# Patient Record
Sex: Female | Born: 1965 | Race: White | Hispanic: No | Marital: Married | Smoking: Never smoker
Health system: Southern US, Community
[De-identification: ages and names within clinical notes are randomized; demographics above are authoritative.]

---

## 2002-10-30 ENCOUNTER — Encounter (HOSPITAL_COMMUNITY): Admission: RE | Admit: 2002-10-30 | Discharge: 2002-11-29 | Payer: Self-pay | Admitting: Internal Medicine

## 2002-10-30 ENCOUNTER — Encounter: Payer: Self-pay | Admitting: Internal Medicine

## 2002-11-04 ENCOUNTER — Encounter: Payer: Self-pay | Admitting: Internal Medicine

## 2003-11-22 ENCOUNTER — Ambulatory Visit (HOSPITAL_COMMUNITY): Admission: RE | Admit: 2003-11-22 | Discharge: 2003-11-22 | Payer: Self-pay

## 2004-09-11 ENCOUNTER — Ambulatory Visit (HOSPITAL_COMMUNITY): Admission: RE | Admit: 2004-09-11 | Discharge: 2004-09-11 | Payer: Self-pay | Admitting: Obstetrics and Gynecology

## 2005-04-30 ENCOUNTER — Ambulatory Visit (HOSPITAL_COMMUNITY): Admission: RE | Admit: 2005-04-30 | Discharge: 2005-04-30 | Payer: Self-pay | Admitting: Internal Medicine

## 2007-01-10 ENCOUNTER — Ambulatory Visit (HOSPITAL_COMMUNITY): Admission: RE | Admit: 2007-01-10 | Discharge: 2007-01-10 | Payer: Self-pay | Admitting: Gastroenterology

## 2007-01-13 ENCOUNTER — Other Ambulatory Visit: Admission: RE | Admit: 2007-01-13 | Discharge: 2007-01-13 | Payer: Self-pay | Admitting: Obstetrics & Gynecology

## 2008-01-02 ENCOUNTER — Encounter (INDEPENDENT_AMBULATORY_CARE_PROVIDER_SITE_OTHER): Payer: Self-pay | Admitting: Orthopedic Surgery

## 2008-01-02 ENCOUNTER — Ambulatory Visit (HOSPITAL_BASED_OUTPATIENT_CLINIC_OR_DEPARTMENT_OTHER): Admission: RE | Admit: 2008-01-02 | Discharge: 2008-01-02 | Payer: Self-pay | Admitting: Orthopedic Surgery

## 2008-05-02 ENCOUNTER — Ambulatory Visit (HOSPITAL_COMMUNITY): Admission: RE | Admit: 2008-05-02 | Discharge: 2008-05-02 | Payer: Self-pay | Admitting: Internal Medicine

## 2008-05-21 ENCOUNTER — Other Ambulatory Visit: Admission: RE | Admit: 2008-05-21 | Discharge: 2008-05-21 | Payer: Self-pay | Admitting: Obstetrics & Gynecology

## 2010-12-26 NOTE — Op Note (Signed)
NAMEMORAYO, LEVEN               ACCOUNT NO.:  [id number]   MEDICAL RECORD NO.:  [id number]          PATIENT TYPE:  AMB   LOCATION:  DSC                          FACILITY:  [facility]   PHYSICIAN:  Cindee Salt, M.D.       DATE OF BIRTH:  [birth year]   DATE OF PROCEDURE:  01/02/2008  DATE OF DISCHARGE:                               OPERATIVE REPORT   PREOPERATIVE DIAGNOSIS:  Scapholunate ligament tear.   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   OPERATION:  Arthroscopy, debridement, biopsy, left wrist.   SURGEON:  Cindee Salt, MD   ASSISTANT:  Iman, RN   ANESTHESIA:  Axillary block.   DATE OF OPERATION:  Jan 02, 2008.   ANESTHESIOLOGIST:  Zenon Mayo, MD   HISTORY:  The patient is a 45 year old female with a history of wrist  pain.  This has not responded to conservative treatment.  MRI reveals a  scapholunate ligament injury.  She has no history of medical problems.  She is aware of risks and complications including infection, recurrence,  injury to arteries, nerves, and tendons, incomplete relief of symptoms,  and dystrophy.  Preoperatively, the patient is seen, questions  encouraged, and answered.  The extremity marked by both the patient and  surgeon.   PROCEDURE:  The patient was brought to the operating room, where an  axillary block was carried out without difficulty.  She was prepped  using DuraPrep, supine position, left arm free.  The limb was placed in  the arthroscopy tower and 10 pounds traction applied.  A time-out was  performed prior to placement.  The joint was inflated with 3-4 portals.  Using a 22-gauge needle, a transverse incision was made, deepened with  hemostat.  Blunt trocar was used to enter the joint, joint was  inspected, obvious crystalline deposition was present the articular  cartilage, the synovial tissue.  A tear of the scapholunate ligament was  immediately  apparent as was a tear of the lunotriquetral ligament. An  irrigation catheter was placed in 6U.  A 6R portal opened after  localization with a 22-gauge needle.  Biopsies were then performed of  any loose material in the joint including crystalline deposition.  This  was placed in absolute alcohol and sent to pathology for inspection.  The midcarpal joint was inspected.  Again, crystalline deposition was  noted in all the articular surfaces of all the bones.  Some thinning of  the cartilage was noted.  Significant instability of the lunate from  both scaphoid and lunate's triquetral side was noted.  A debridement was  then performed.  A ganglion cyst had formed on the dorsal surface of the  scapholunate ligament complex, this was debrided with an ArthroWand.  Limited shrinkage of the remaining scapholunate ligament was performed  with the ArthroWand on low power.  The wound was irrigated.  Partial  synovectomy performed using the ArthroWand.  The instruments were  removed.  The portals closed with interrupted 5-0 Vicryl Rapide sutures.  Sterile compressive and dorsal  palmar splint applied.  The patient tolerated the procedure well, was  taken to the recovery room for observation in satisfactory condition.  She will be discharged home to return to the hand center of [facility]  in 1 week on Percocet.           ______________________________  Cindee Salt, M.D.     GK/MEDQ  D:  01/02/2008  T:  01/03/2008  Job:  [id number]   cc:   Kingsley Callander. Ouida Sills, MD

## 2010-12-26 NOTE — Op Note (Signed)
NAMESONJI, Emma Compton               ACCOUNT NO.:  [id number]   MEDICAL RECORD NO.:  [id number]          PATIENT TYPE:  AMB   LOCATION:  ENDO                         FACILITY:  [facility]   PHYSICIAN:  Anselmo Rod, M.D.  DATE OF BIRTH:  Nov 17, 1965   DATE OF PROCEDURE:  01/10/2007  DATE OF DISCHARGE:  01/10/2007                               OPERATIVE REPORT   PROCEDURE PERFORMED:  Screening colonoscopy.   ENDOSCOPIST:  Anselmo Rod, M.D.   INSTRUMENT USED:  Pentax video colonoscope.   INDICATIONS FOR PROCEDURE:  A 45 year old [place] female with a  history of rectal bleeding, rule out colonic polyps, masses, etc.   PREPROCEDURE PREPARATION:  Informed consent was procured from the  patient.  The patient fasted for 4 hours prior to the procedure and  prepped with 32 OsmoPrep pills the night prior to the procedure. The  risks and benefits of the procedure including a 10% miss rate of cancer  and polyp were discussed with the patient as well.   PREPROCEDURE PHYSICAL:  The patient had stable vital signs.  Neck  supple.  Chest clear to auscultation.  S1 and S2 regular.  Abdomen soft  with normal bowel sounds.   DESCRIPTION OF PROCEDURE:  The patient was placed in the left lateral  decubitus position and sedated with 100 mcg of Fentanyl and 7.5 mg of  Versed given intravenously in slow incremental doses. Once the patient  was adequately sedated and maintained on low-flow oxygen and continuous  cardiac monitoring the Pentax video colonoscope was advanced from the  rectum to the cecum.  The appendiceal orifice and ileocecal valve were  clearly visualized and photographed.  The terminal ileum appeared  normal.  Retroflexion in the rectum revealed no abnormalities. No  masses, polyps or diverticula were noted. The patient tolerated the  procedure well without complications. On starting the procedure the  scope accidentally slipped into the vagina and a somewhat inflamed,  edematous  cervix was noted. I informed the patient and her husband about  this finding.   IMPRESSION:  Normal colonoscopy of the terminal ileum.  No masses,  polyps or diverticula noted.   RECOMMENDATIONS:  1.A gynecological examination has been requested. Dr.  Tresa Res has been contacted.  She will see the patient on 01/13/2007.  The  patient is aware of that.  2.Repeat colonoscopy in the next 10 years.  If the patient has any  abnormal symptoms in the interim she is to contact the office  immediately for further recommendations.  3.Outpatient follow-up as need arises in the future.      Anselmo Rod, M.D.  Electronically Signed     JNM/MEDQ  D:  01/13/2007  T:  01/13/2007  Job:  [id number]   cc:   Kingsley Callander. Ouida Sills, MD  Edwena Felty. Romine, M.D.

## 2010-12-29 NOTE — Op Note (Signed)
NAMEPIETRA, Emma Compton                           ACCOUNT NO.:  [id number]   MEDICAL RECORD NO.:  [id number]                   PATIENT TYPE:  AMB   LOCATION:                                       FACILITY:   PHYSICIAN:  Oley Balm. Pricilla Holm, D.P.M.             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   PROCEDURE:  Interphalangeal (IP) fusion, end-to-end anastomosis third toe  left foot.   SURGEON:  Oley Balm. Pricilla Holm, D.P.M.   TYPE OF ANESTHESIA:  Local.   INDICATIONS FOR SURGERY:  Longstanding history of pain unrelieved by  conservative care.   DESCRIPTION OF PROCEDURE:  The patient brought to the operating room and  placed on the operating table in the supine position  The patient's lower  right foot and leg was then prepped and draped in the usual aseptic manner.  Then, with an ankle tourniquet placed and well-padded to prevent contusion;  elevated to 250 mmHg, after exsanguination of the left foot, the following  surgical procedures were then performed under local anesthesia with a local  infiltrate of 2% Xylocaine and 0.05% Marcaine 5 cc.   IP FUSION THIRD TOE LEFT FOOT.  Attention was directed to the dorsal aspect  of the third toe of the left foot where 2 semi-elliptical converting skin  incisions were made.  The incision was widened and deepened via sharp and  blunt dissection being sure to identify and retract all vital structures.  A  capsular incision was then made and the interphalangeal joint was then  freed.  It was noted that the distal phalanx had been plantarly dislocated.  The wound was then lavaged with copious amounts of sterile saline and the  distal aspect of the middle phalanx as well as the distal  aspect of the  proximal phalanx were resected utilizing the Zimmer oscillating saw.  The  two opposing edges were then opposed and a 0.045 K-wire was retrograded  through the toe.   It was noted that the toe was deviated laterally so the K-wire was removed.  More bone was taken off medially and then the K-wire reinserted.  It was  noted that the toe was in a correct anatomical as well as functional  position.  The wound was lavaged with copious amounts of sterile saline and  the capsule and subcutaneous tissues were approximated with continuous  sutures of 4-0 Dexon.  The skin was approximated utilizing, running  subcuticular sutures of 4-0 Prolene.   All surgical sites were infiltrated with approximately 1/8 cc of  dexamethasone phosphate; and mild compressive bandages consisting of  Betadine soaked Adaptic, sterile 4 x 4's  and sterile Kling were then  applied.  The patient tolerated the procedure well and left the operating  room in apparent good condition, vital signs stable to recovery room.      ___________________________________________                                            Oley Balm Pricilla Holm, D.P.M.   DBT/MEDQ  D:  11/22/2003  T:  11/22/2003  Job:  [id number]   cc:   Lionel December, M.D.  [address]  [place]  [place] [address]  Fax: [phone]

## 2010-12-29 NOTE — H&P (Signed)
Emma Compton, UHLIG                         ACCOUNT NO.:  [id number]   MEDICAL RECORD NO.:  [id number]                   PATIENT TYPE:  AMB   LOCATION:  DAY                                  FACILITY:  [facility]   PHYSICIAN:  Oley Balm. Pricilla Holm, D.P.M.             DATE OF BIRTH:  03/10/1966   DATE OF ADMISSION:  11/22/2003  DATE OF DISCHARGE:                                HISTORY & PHYSICAL   Ms. Guedes is a 45 year old female that presented to the office with a chief  complaints of painful third toe of the left foot.  The patient had a  dorsally-contracted third toe.  She had an injury to the toe many years ago  and since that time she has had a dorsal contracture of the third toe with  adduction deformity noted.  The patient relates that she has difficulty  wearing end-closed shoes and requests surgical correction of same.   PREVIOUS HOSPITALIZATION/SURGERY:  Two children.   MEDICATIONS:  No medications.   ALLERGIES:  No allergies.   No transfusions or hepatitis.   REVIEW OF SYSTEMS:  Essentially uneventful.   EXTREMITIES:  Lower extremity exam reveals palpable pedal pulses.  Both DP  and PT with spontaneous capillary filling time.  NEUROLOGIC:  Essentially within normal limits.  MUSCULOSKELETAL:  The patient has a dorsally contracted third toe of the  left foot with an adduction deformity noted at the proximal interphalangeal  joint underlapping the second toe.  The patient also, on standing, has a  dorsally- contracted third toe.   X-rays taken reveal that the patient has had a previous dislocation of the  third toe of the left foot at the proximal interphalangeal joint.  This  deformity is rigid.   ASSESSMENT:  Hammer toe deformity with adduction deformity.   PLAN:  I have reviewed both conservative and surgical management.  I  reviewed with her procedures which consist of an IP fusion of the second toe  of the left foot.  I reviewed the procedures, the complications of  the  procedures, such as infection, bone infection, postoperative pain, swelling,  etc.  The patient understands the same.  Surgical consent form was reviewed  and signed and the patient is scheduled for surgery on November 22, 2003.     ___________________________________________                                         Oley Balm. Pricilla Holm, D.P.M.   DBT/MEDQ  D:  11/21/2003  T:  11/22/2003  Job:  [id number]   cc:   Lionel December, M.D.  [address]  [place]  [place] [address]  Fax: [phone]

## 2014-01-21 ENCOUNTER — Ambulatory Visit (HOSPITAL_COMMUNITY)
Admission: RE | Admit: 2014-01-21 | Discharge: 2014-01-21 | Disposition: A | Discharge status: Home / Self Care | Payer: 59 | Source: Ambulatory Visit | Attending: Internal Medicine | Admitting: Internal Medicine

## 2014-01-21 ENCOUNTER — Other Ambulatory Visit (HOSPITAL_COMMUNITY): Payer: Self-pay | Admitting: Internal Medicine

## 2014-01-21 DIAGNOSIS — M79609 Pain in unspecified limb: Secondary | ICD-10-CM | POA: Insufficient documentation

## 2014-01-21 DIAGNOSIS — R52 Pain, unspecified: Secondary | ICD-10-CM

## 2014-08-09 ENCOUNTER — Other Ambulatory Visit (HOSPITAL_COMMUNITY): Payer: Self-pay | Admitting: Internal Medicine

## 2014-08-09 DIAGNOSIS — G44209 Tension-type headache, unspecified, not intractable: Secondary | ICD-10-CM

## 2014-08-11 ENCOUNTER — Ambulatory Visit (HOSPITAL_COMMUNITY)
Admission: RE | Admit: 2014-08-11 | Discharge: 2014-08-11 | Disposition: A | Discharge status: Home / Self Care | Payer: 59 | Source: Ambulatory Visit | Attending: Internal Medicine | Admitting: Internal Medicine

## 2014-08-11 DIAGNOSIS — I679 Cerebrovascular disease, unspecified: Secondary | ICD-10-CM | POA: Diagnosis not present

## 2014-08-11 DIAGNOSIS — R110 Nausea: Secondary | ICD-10-CM | POA: Diagnosis present

## 2014-08-11 DIAGNOSIS — J342 Deviated nasal septum: Secondary | ICD-10-CM | POA: Insufficient documentation

## 2014-08-11 DIAGNOSIS — R51 Headache: Secondary | ICD-10-CM | POA: Insufficient documentation

## 2014-08-11 DIAGNOSIS — G44209 Tension-type headache, unspecified, not intractable: Secondary | ICD-10-CM

## 2015-09-27 DIAGNOSIS — Z6826 Body mass index (BMI) 26.0-26.9, adult: Secondary | ICD-10-CM | POA: Diagnosis not present

## 2015-09-27 DIAGNOSIS — J029 Acute pharyngitis, unspecified: Secondary | ICD-10-CM | POA: Diagnosis not present

## 2015-10-16 IMAGING — CT CT HEAD W/O CM
1 series · 16 of 30 positions shown, 20 images · non-contrast
Comparison: None.

CLINICAL DATA: Two week history of intermittent headache and nausea

EXAM:
CT HEAD WITHOUT CONTRAST
TECHNIQUE: Contiguous axial images were obtained from the base of the skull
through the vertex without intravenous contrast.

[Series 2: headseq 4.8 h37s · axial · 0.43mm/px · z∈[+93,+251]mm · 16 of 36 slices shown, 20 images]
[im 2/36  brain]
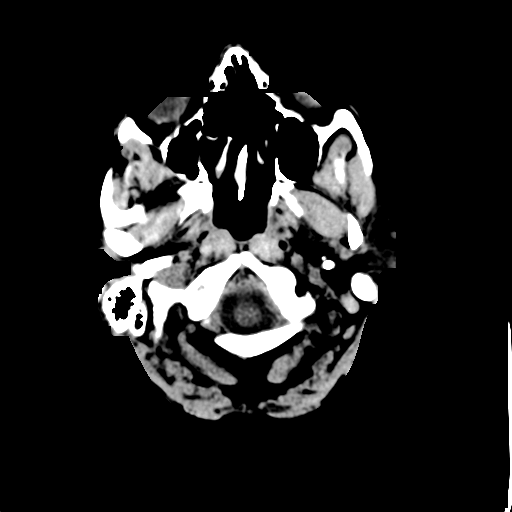
[im 2/36  bone]
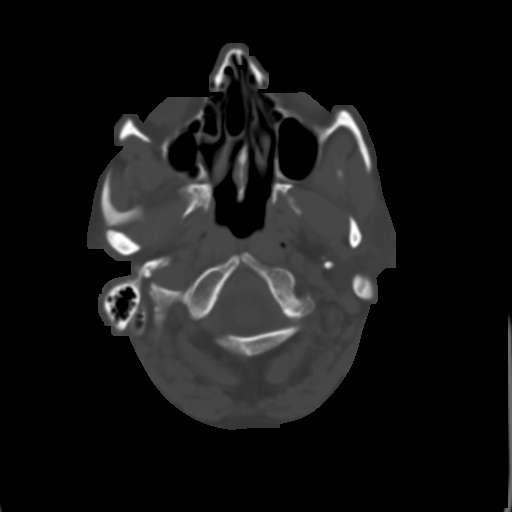
[im 4/36  brain]
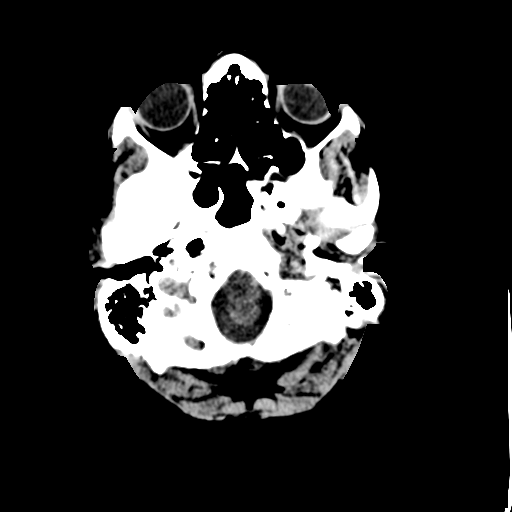
[im 7/36  brain]
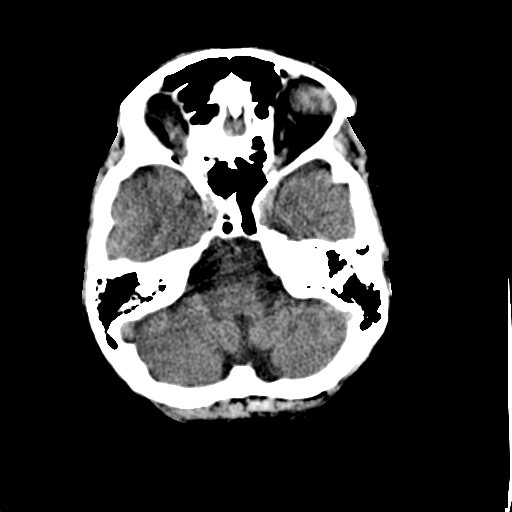
[im 9/36  brain]
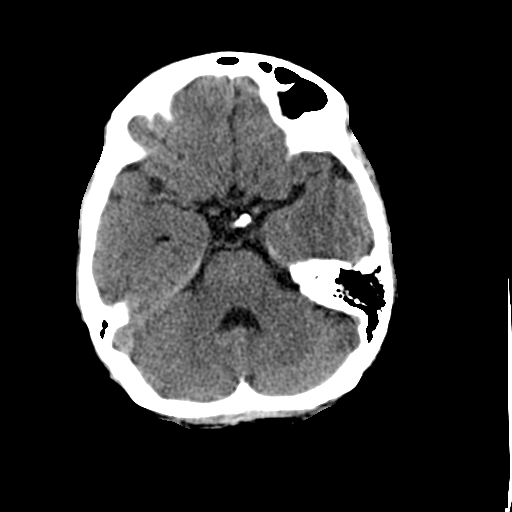
[im 10/36  brain]
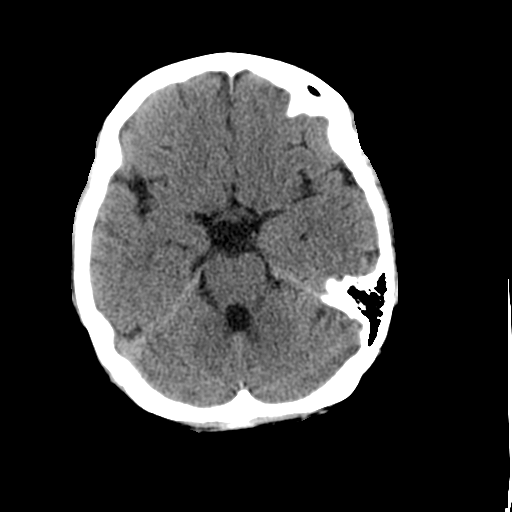
[im 10/36  bone]
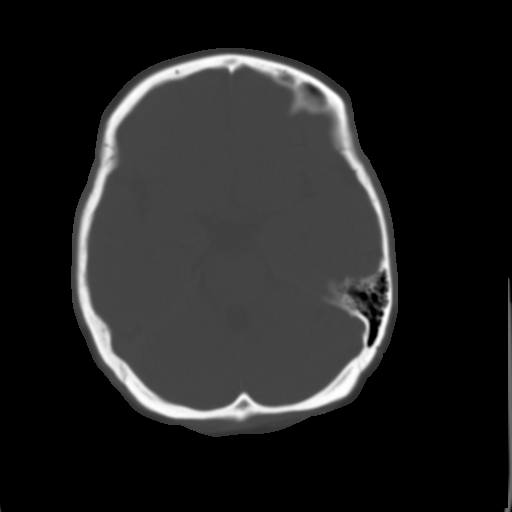
[im 13/36  brain]
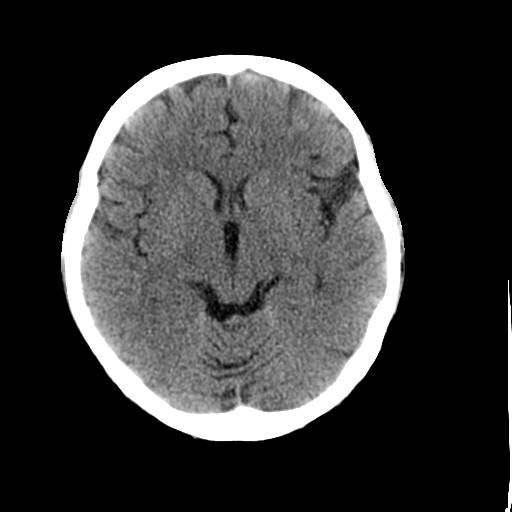
[im 15/36  brain]
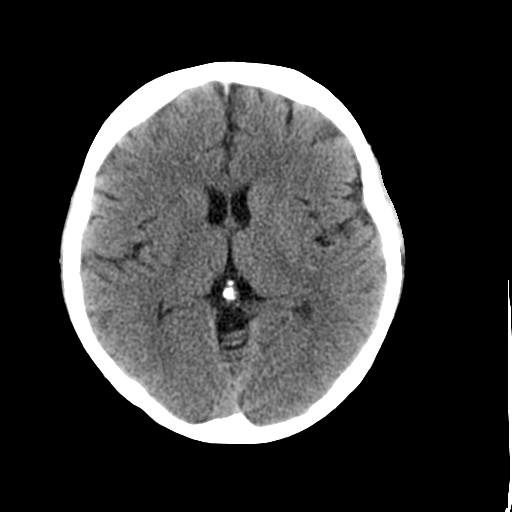
[im 17/36  brain]
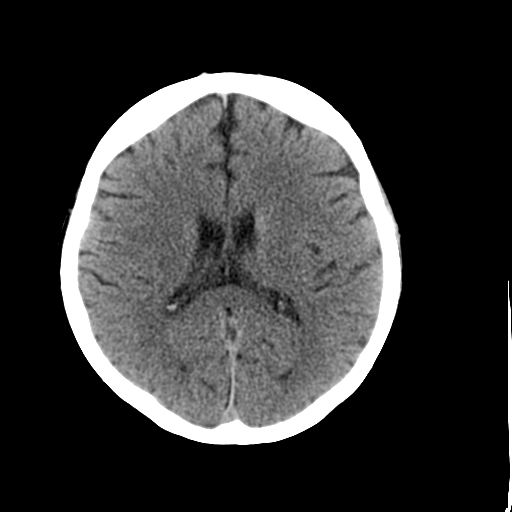
[im 19/36  brain]
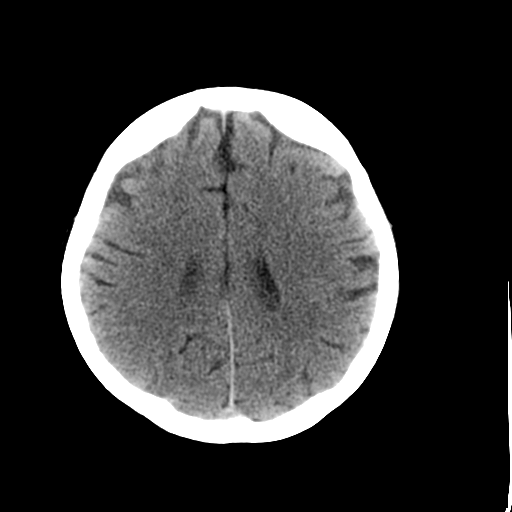
[im 19/36  bone]
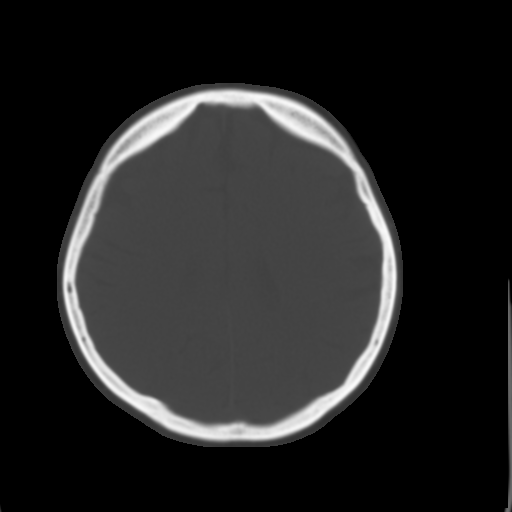
[im 21/36  brain]
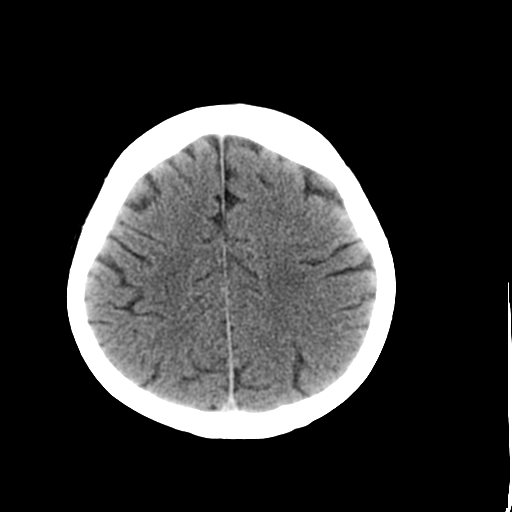
[im 23/36  brain]
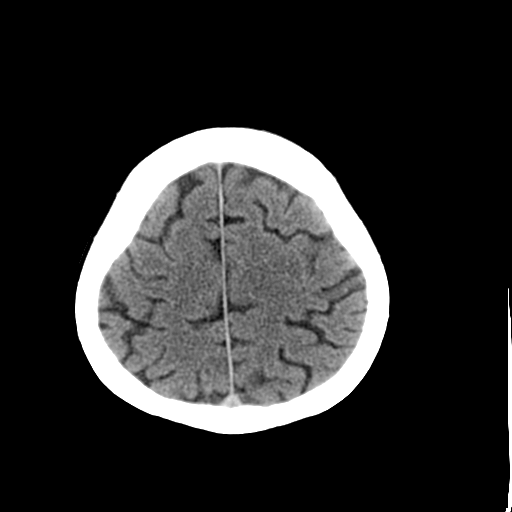
[im 26/36  brain]
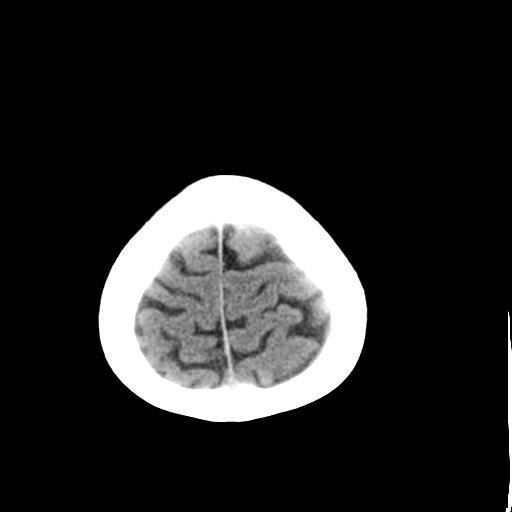
[im 27/36  brain]
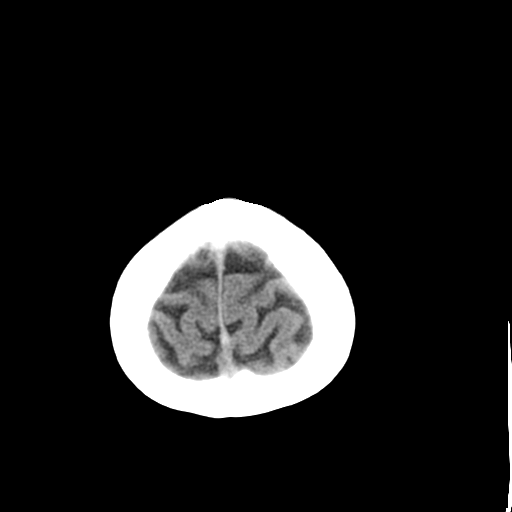
[im 27/36  bone]
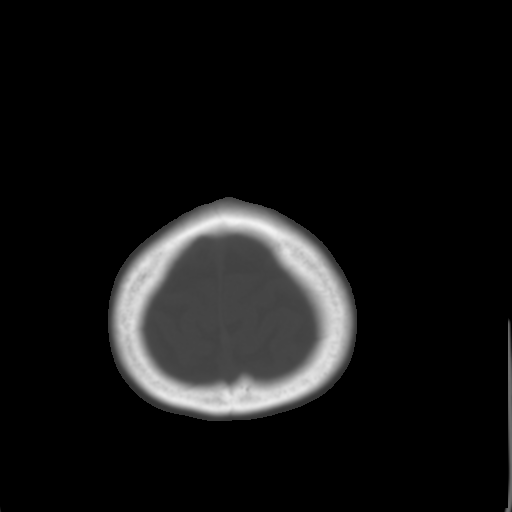
[im 29/36  brain]
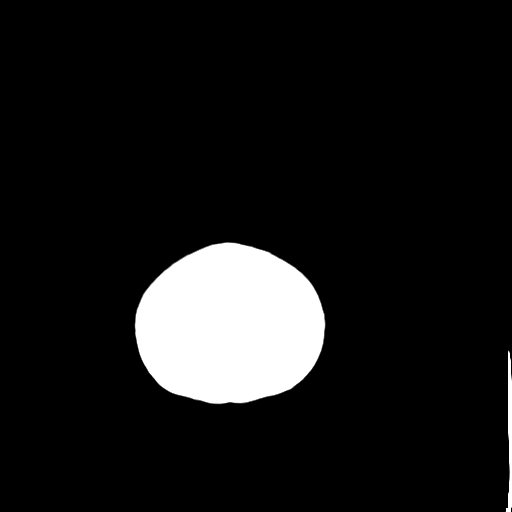
[im 32/36  brain]
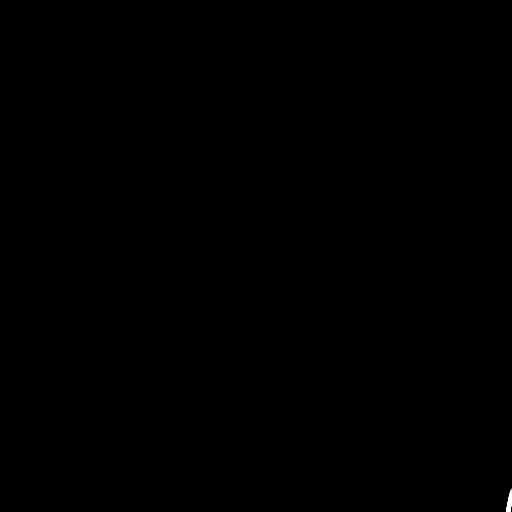
[im 34/36  brain]
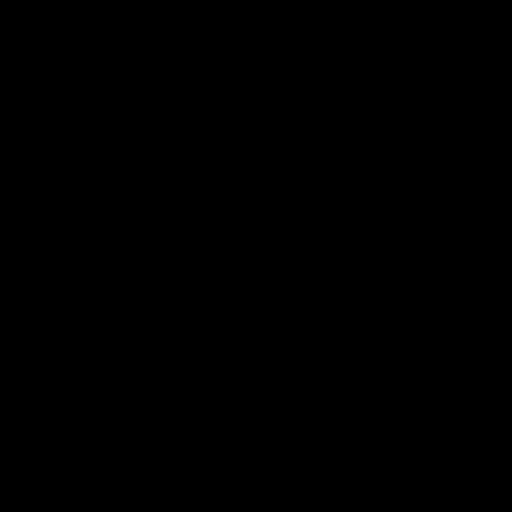

[16 of 30 positions shown; findings below may reference images not displayed]

FINDINGS: The ventricles are normal in size and configuration. There is no
appreciable mass, hemorrhage, extra-axial fluid collection, or
midline shift. There is rather minimal small vessel disease in the
centra semiovale bilaterally. Elsewhere gray-white compartments
appear normal. No acute infarct apparent. The bony calvarium appears
intact. The mastoid air cells are clear. There is a large concha
bullosa on the right, an anatomic variant. There is leftward
deviation of the nasal septum.
IMPRESSION: Minimal periventricular small vessel disease. No intracranial mass,
hemorrhage, or acute appearing infarct.

## 2015-12-15 MED FILL — ULORIC 40 MG TABLET: 40 | 90 days supply | Qty: 135 | Fill #1

## 2016-04-11 MED FILL — COLCHICINE 0.6 MG TABLET: 0.6 | 30 days supply | Qty: 60 | Fill #0

## 2016-04-30 MED FILL — ULORIC 40 MG TABLET: 40 | 90 days supply | Qty: 135 | Fill #2

## 2016-06-19 DIAGNOSIS — Z1231 Encounter for screening mammogram for malignant neoplasm of breast: Secondary | ICD-10-CM | POA: Diagnosis not present

## 2016-06-19 DIAGNOSIS — Z780 Asymptomatic menopausal state: Secondary | ICD-10-CM | POA: Diagnosis not present

## 2016-06-19 DIAGNOSIS — Z01419 Encounter for gynecological examination (general) (routine) without abnormal findings: Secondary | ICD-10-CM | POA: Diagnosis not present

## 2016-06-25 MED FILL — COMBIPATCH 0.05-0.25 MG PTC: 0.05-0.25 | 28 days supply | Qty: 8 | Fill #0

## 2016-07-31 MED FILL — COMBIPATCH 0.05-0.25 MG PTC: 0.05-0.25 | 28 days supply | Qty: 8 | Fill #1

## 2016-10-17 MED FILL — ULORIC 40 MG TABLET: 40 | 30 days supply | Qty: 45 | Fill #0

## 2016-11-12 MED FILL — COMBIPATCH 0.05-0.25 MG PTC: 0.05-0.25 | 28 days supply | Qty: 8 | Fill #2

## 2016-11-30 MED FILL — ULORIC 40 MG TABLET: 40 | 30 days supply | Qty: 30 | Fill #1

## 2016-12-06 MED FILL — COMBIPATCH 0.05-0.25 MG PTC: 0.05-0.25 | 28 days supply | Qty: 8 | Fill #3

## 2017-01-08 MED FILL — COMBIPATCH 0.05-0.25 MG PTC: 0.05-0.25 | 28 days supply | Qty: 8 | Fill #4

## 2017-01-23 MED FILL — ULORIC 40 MG TABLET: 40 | 30 days supply | Qty: 30 | Fill #0

## 2017-03-04 MED FILL — ULORIC 40 MG TABLET: 40 | 30 days supply | Qty: 30 | Fill #1

## 2017-03-26 MED FILL — COMBIPATCH 0.05-0.25 MG PTC: 0.05-0.25 | 28 days supply | Qty: 8 | Fill #5

## 2017-04-09 MED FILL — ULORIC 40 MG TABLET: 40 | 30 days supply | Qty: 30 | Fill #2

## 2017-04-29 MED FILL — COMBIPATCH 0.05-0.25 MG PTC: 0.05-0.25 | 28 days supply | Qty: 8 | Fill #6

## 2017-05-06 MED FILL — ULORIC 40 MG TABLET: 40 | 30 days supply | Qty: 30 | Fill #3

## 2017-05-27 MED FILL — COMBIPATCH 0.05-0.25 MG PTC: 0.05-0.25 | 28 days supply | Qty: 8 | Fill #7

## 2017-06-03 MED FILL — ULORIC 40 MG TABLET: 40 | 30 days supply | Qty: 30 | Fill #4

## 2017-06-04 DIAGNOSIS — Z79899 Other long term (current) drug therapy: Secondary | ICD-10-CM | POA: Diagnosis not present

## 2017-06-04 DIAGNOSIS — M109 Gout, unspecified: Secondary | ICD-10-CM | POA: Diagnosis not present

## 2017-07-09 MED FILL — ULORIC 40 MG TABLET: 40 | 30 days supply | Qty: 30 | Fill #5

## 2017-07-11 MED FILL — COMBIPATCH 0.05-0.25 MG PTC: 0.05-0.25 | 28 days supply | Qty: 8 | Fill #0

## 2017-08-07 MED FILL — ULORIC 40 MG TABLET: 40 | 30 days supply | Qty: 30 | Fill #6

## 2017-08-15 DIAGNOSIS — Z1151 Encounter for screening for human papillomavirus (HPV): Secondary | ICD-10-CM | POA: Diagnosis not present

## 2017-08-15 DIAGNOSIS — Z6825 Body mass index (BMI) 25.0-25.9, adult: Secondary | ICD-10-CM | POA: Diagnosis not present

## 2017-08-15 DIAGNOSIS — Z1231 Encounter for screening mammogram for malignant neoplasm of breast: Secondary | ICD-10-CM | POA: Diagnosis not present

## 2017-08-15 DIAGNOSIS — Z01419 Encounter for gynecological examination (general) (routine) without abnormal findings: Secondary | ICD-10-CM | POA: Diagnosis not present

## 2017-08-15 MED FILL — DUAVEE 0.45-20 MG TABLET: 0.45-20 | 30 days supply | Qty: 30 | Fill #0

## 2017-09-05 MED FILL — ULORIC 40 MG TABLET: 40 | 30 days supply | Qty: 30 | Fill #7

## 2017-09-19 MED FILL — ESTRADIOL 0.05 MG/DAY PATCH: 0.05 | 28 days supply | Qty: 4 | Fill #0

## 2017-09-19 MED FILL — PROGESTERONE 100 MG CAPSULE: 100 | 30 days supply | Qty: 30 | Fill #0

## 2017-10-03 MED FILL — ULORIC 40 MG TABLET: 40 | 30 days supply | Qty: 30 | Fill #8

## 2017-10-16 MED FILL — COMBIPATCH 0.05-0.25 MG PTC: 0.05-0.25 | 28 days supply | Qty: 8 | Fill #0

## 2017-10-31 MED FILL — ULORIC 40 MG TABLET: 40 | 30 days supply | Qty: 30 | Fill #9

## 2017-11-22 MED FILL — COMBIPATCH 0.05-0.25 MG PTC: 0.05-0.25 | 28 days supply | Qty: 8 | Fill #1

## 2017-12-03 MED FILL — ULORIC 40 MG TABLET: 40 | 30 days supply | Qty: 30 | Fill #10

## 2017-12-23 MED FILL — COMBIPATCH 0.05-0.25 MG PTC: 0.05-0.25 | 28 days supply | Qty: 8 | Fill #2

## 2018-01-06 MED FILL — ULORIC 40 MG TABLET: 40 | 30 days supply | Qty: 30 | Fill #11

## 2018-02-03 MED FILL — COMBIPATCH 0.05-0.25 MG PTC: 0.05-0.25 | 28 days supply | Qty: 8 | Fill #3

## 2018-02-03 MED FILL — ULORIC 40 MG TABLET: 40 | 30 days supply | Qty: 30 | Fill #0

## 2018-03-03 MED FILL — FEBUXOSTAT 40 MG TABS: 40 | 30 days supply | Qty: 30 | Fill #1

## 2018-03-03 MED FILL — COMBIPATCH 0.05-0.25 MG PTC: 0.05-0.25 | 28 days supply | Qty: 8 | Fill #4

## 2018-03-31 MED FILL — COMBIPATCH 0.05-0.25 MG PTC: 0.05-0.25 | 28 days supply | Qty: 8 | Fill #5

## 2018-04-07 DIAGNOSIS — N950 Postmenopausal bleeding: Secondary | ICD-10-CM | POA: Diagnosis not present

## 2018-04-08 MED FILL — FEBUXOSTAT 40 MG TABS: 40 | 30 days supply | Qty: 30 | Fill #2

## 2018-04-28 MED FILL — COMBIPATCH 0.05-0.25 MG PTC: 0.05-0.25 | 28 days supply | Qty: 8 | Fill #6

## 2018-05-16 MED FILL — FEBUXOSTAT 40 MG TABS: 40 | 30 days supply | Qty: 30 | Fill #3

## 2018-06-10 MED FILL — COMBIPATCH 0.05-0.25 MG PTC: 0.05-0.25 | 28 days supply | Qty: 8 | Fill #7

## 2018-06-20 MED FILL — FEBUXOSTAT 40 MG TABS: 40 | 30 days supply | Qty: 30 | Fill #4

## 2018-07-01 DIAGNOSIS — Z23 Encounter for immunization: Secondary | ICD-10-CM | POA: Diagnosis not present

## 2018-07-16 MED FILL — FEBUXOSTAT 40 MG TABS: 40 | 30 days supply | Qty: 30 | Fill #5

## 2018-08-27 MED FILL — COMBIPATCH 0.05-0.25 MG PTC: 0.05-0.25 | 28 days supply | Qty: 8 | Fill #8

## 2018-08-27 MED FILL — FEBUXOSTAT 40 MG TABS: 40 | 30 days supply | Qty: 30 | Fill #6

## 2018-09-24 MED FILL — FEBUXOSTAT 40 MG TABS: 40 | 30 days supply | Qty: 30 | Fill #7

## 2018-09-24 MED FILL — COMBIPATCH 0.05-0.25 MG PTC: 0.05-0.25 | 28 days supply | Qty: 8 | Fill #9

## 2018-10-13 MED FILL — MELOXICAM 15 MG TABLET: 15 | 30 days supply | Qty: 30 | Fill #0

## 2018-10-21 MED FILL — COMBIPATCH 0.05-0.25 MG PTC: 0.05-0.25 | 28 days supply | Qty: 8 | Fill #0 | Status: TO

## 2018-10-27 MED FILL — FEBUXOSTAT 40 MG TABS: 40 | 30 days supply | Qty: 30 | Fill #8 | Status: TO

## 2018-12-01 MED FILL — COMBIPATCH 0.05-0.25 MG PTC: 0.05-0.25 | 28 days supply | Qty: 8 | Fill #0

## 2018-12-01 MED FILL — FEBUXOSTAT 40 MG TABS: 40 | 30 days supply | Qty: 30 | Fill #0

## 2018-12-26 MED FILL — FEBUXOSTAT 40 MG TABS: 40 | 30 days supply | Qty: 30 | Fill #1

## 2018-12-26 MED FILL — COMBIPATCH 0.05-0.25 MG PTC: 0.05-0.25 | 28 days supply | Qty: 8 | Fill #1

## 2019-01-27 MED FILL — FEBUXOSTAT 40 MG TABS: 40 | 30 days supply | Qty: 30 | Fill #2

## 2019-02-11 MED FILL — COMBIPATCH 0.05-0.25 MG PTC: 0.05-0.25 | 28 days supply | Qty: 8 | Fill #2

## 2019-03-06 MED FILL — FEBUXOSTAT 40 MG TABS: 40 | 30 days supply | Qty: 30 | Fill #0

## 2019-04-06 MED FILL — COMBIPATCH 0.05-0.25 MG PTC: 0.05-0.25 | 28 days supply | Qty: 8 | Fill #3

## 2019-04-06 MED FILL — FEBUXOSTAT 40 MG TABS: 40 | 30 days supply | Qty: 30 | Fill #1

## 2019-05-04 MED FILL — FEBUXOSTAT 40 MG TABS: 40 | 30 days supply | Qty: 30 | Fill #2

## 2019-05-04 MED FILL — COMBIPATCH 0.05-0.25 MG PTC: 0.05-0.25 | 28 days supply | Qty: 8 | Fill #0

## 2019-06-08 MED FILL — FEBUXOSTAT 40 MG TABS: 40 | 30 days supply | Qty: 30 | Fill #3

## 2019-07-02 MED FILL — FEBUXOSTAT 40 MG TABS: 40 | 30 days supply | Qty: 30 | Fill #0

## 2019-07-22 ENCOUNTER — Other Ambulatory Visit: Payer: Self-pay

## 2019-07-22 DIAGNOSIS — Z20822 Contact with and (suspected) exposure to covid-19: Secondary | ICD-10-CM

## 2019-07-23 LAB — NOVEL CORONAVIRUS, NAA: SARS-CoV-2, NAA: NOT DETECTED

## 2019-08-03 MED FILL — FEBUXOSTAT 40 MG TABS: 40 | 30 days supply | Qty: 30 | Fill #1

## 2019-09-03 MED FILL — FEBUXOSTAT 40 MG TABS: 40 | 30 days supply | Qty: 30 | Fill #2

## 2019-10-07 MED FILL — FEBUXOSTAT 40 MG TABS: 40 | 30 days supply | Qty: 30 | Fill #3

## 2019-11-04 MED FILL — FEBUXOSTAT 40 MG TABS: 40 | 30 days supply | Qty: 30 | Fill #4

## 2019-12-03 MED FILL — FEBUXOSTAT 40 MG TABS: 40 | 30 days supply | Qty: 30 | Fill #5

## 2020-01-04 MED FILL — FEBUXOSTAT 40 MG TABS: 40 | 30 days supply | Qty: 30 | Fill #6

## 2020-02-04 MED FILL — FEBUXOSTAT 40 MG TABS: 40 | 30 days supply | Qty: 30 | Fill #7

## 2020-03-07 MED FILL — FEBUXOSTAT 40 MG TABS: 40 | 30 days supply | Qty: 30 | Fill #0

## 2020-04-04 MED FILL — FEBUXOSTAT 40 MG TABS: 40 | 30 days supply | Qty: 30 | Fill #0

## 2020-05-06 MED FILL — FEBUXOSTAT 40 MG TABS: 40 | 30 days supply | Qty: 30 | Fill #1

## 2020-06-01 MED FILL — FEBUXOSTAT 40 MG TABS: 40 | 30 days supply | Qty: 30 | Fill #2

## 2020-07-11 MED FILL — FEBUXOSTAT 40 MG TABS: 40 | 30 days supply | Qty: 30 | Fill #3

## 2020-08-04 ENCOUNTER — Ambulatory Visit: Payer: Self-pay | Attending: Internal Medicine

## 2020-08-04 DIAGNOSIS — Z23 Encounter for immunization: Secondary | ICD-10-CM

## 2020-08-04 MED FILL — FEBUXOSTAT 40 MG TABS: 40 | 30 days supply | Qty: 30 | Fill #4

## 2020-08-04 NOTE — Progress Notes (Signed)
   Covid-19 Vaccination Clinic  Name:  SARAH BAEZ    MRN: [id number] DOB: 1966/05/26  08/04/2020  Ms. Savant was observed post Covid-19 immunization for 15 minutes without incident. She was provided with Vaccine Information Sheet and instruction to access the V-Safe system.   Ms. Lenig was instructed to call 911 with any severe reactions post vaccine: Marland Kitchen Difficulty breathing  . Swelling of face and throat  . A fast heartbeat  . A bad rash all over body  . Dizziness and weakness   Immunizations Administered    Name Date Dose VIS Date Route   Moderna Covid-19 Booster Vaccine 08/04/2020  2:20 PM 0.25 mL 06/01/2020 Intramuscular   Manufacturer: Moderna   Lot: [id number]   NDC: 70761-518-34

## 2020-09-02 ENCOUNTER — Other Ambulatory Visit (HOSPITAL_COMMUNITY): Payer: Self-pay | Admitting: Internal Medicine

## 2020-09-02 MED FILL — FEBUXOSTAT 40 MG TABS: 40 | 30 days supply | Qty: 30 | Fill #0

## 2020-10-04 MED FILL — FEBUXOSTAT 40 MG TABS: 40 | 30 days supply | Qty: 30 | Fill #1

## 2020-10-14 MED FILL — FEBUXOSTAT 40 MG TABS: 40 | 30 days supply | Qty: 30 | Fill #1

## 2020-11-12 ENCOUNTER — Other Ambulatory Visit (HOSPITAL_COMMUNITY): Payer: Self-pay

## 2020-11-15 ENCOUNTER — Other Ambulatory Visit (HOSPITAL_COMMUNITY): Payer: Self-pay

## 2020-11-15 MED FILL — Febuxostat Tab 40 MG: ORAL | 30 days supply | Qty: 30 | Fill #0 | Status: AC

## 2020-11-18 ENCOUNTER — Other Ambulatory Visit (HOSPITAL_COMMUNITY): Payer: Self-pay

## 2020-11-18 MED ORDER — ESTRADIOL 10 MCG VA TABS
1.0000 | ORAL_TABLET | VAGINAL | 3 refills | Status: DC
  Filled 2020-11-18: qty 24, 84d supply, fill #0
  Filled 2021-04-10: qty 24, 84d supply, fill #1
  Filled 2021-09-03: qty 24, 84d supply, fill #2

## 2020-11-21 ENCOUNTER — Other Ambulatory Visit (HOSPITAL_COMMUNITY): Payer: Self-pay

## 2020-11-22 ENCOUNTER — Other Ambulatory Visit (HOSPITAL_COMMUNITY): Payer: Self-pay

## 2020-12-09 ENCOUNTER — Other Ambulatory Visit (HOSPITAL_COMMUNITY): Payer: Self-pay

## 2020-12-14 ENCOUNTER — Other Ambulatory Visit (HOSPITAL_COMMUNITY): Payer: Self-pay

## 2020-12-16 ENCOUNTER — Other Ambulatory Visit (HOSPITAL_COMMUNITY): Payer: Self-pay

## 2020-12-16 MED FILL — Febuxostat Tab 40 MG: ORAL | 30 days supply | Qty: 30 | Fill #1 | Status: AC

## 2021-01-12 MED FILL — Febuxostat Tab 40 MG: ORAL | 30 days supply | Qty: 30 | Fill #2 | Status: AC

## 2021-01-13 ENCOUNTER — Other Ambulatory Visit (HOSPITAL_COMMUNITY): Payer: Self-pay

## 2021-02-12 MED FILL — Febuxostat Tab 40 MG: ORAL | 30 days supply | Qty: 30 | Fill #3 | Status: AC

## 2021-02-14 ENCOUNTER — Other Ambulatory Visit (HOSPITAL_COMMUNITY): Payer: Self-pay

## 2021-03-14 MED FILL — Febuxostat Tab 40 MG: ORAL | 30 days supply | Qty: 30 | Fill #4 | Status: AC

## 2021-03-15 ENCOUNTER — Other Ambulatory Visit (HOSPITAL_COMMUNITY): Payer: Self-pay

## 2021-03-20 ENCOUNTER — Other Ambulatory Visit (HOSPITAL_BASED_OUTPATIENT_CLINIC_OR_DEPARTMENT_OTHER): Payer: Self-pay

## 2021-03-20 MED ORDER — LEVOTHYROXINE SODIUM 50 MCG PO TABS
50.0000 ug | ORAL_TABLET | Freq: Every day | ORAL | 3 refills | Status: DC
  Filled 2021-03-27 (×2): qty 90, 90d supply, fill #0
  Filled 2021-06-26: qty 90, 90d supply, fill #1
  Filled 2021-06-26: qty 90, 90d supply, fill #0
  Filled 2021-10-08: qty 90, 90d supply, fill #1
  Filled 2022-01-24: qty 90, 90d supply, fill #2

## 2021-03-27 ENCOUNTER — Other Ambulatory Visit (HOSPITAL_BASED_OUTPATIENT_CLINIC_OR_DEPARTMENT_OTHER): Payer: Self-pay

## 2021-03-27 ENCOUNTER — Other Ambulatory Visit (HOSPITAL_COMMUNITY): Payer: Self-pay

## 2021-03-28 ENCOUNTER — Other Ambulatory Visit (HOSPITAL_COMMUNITY): Payer: Self-pay

## 2021-04-10 ENCOUNTER — Other Ambulatory Visit (HOSPITAL_BASED_OUTPATIENT_CLINIC_OR_DEPARTMENT_OTHER): Payer: Self-pay

## 2021-04-10 ENCOUNTER — Other Ambulatory Visit (HOSPITAL_COMMUNITY): Payer: Self-pay

## 2021-04-10 MED ORDER — FEBUXOSTAT 40 MG PO TABS
ORAL_TABLET | ORAL | 6 refills | Status: DC
  Filled 2021-04-10 – 2021-04-11 (×2): qty 30, 30d supply, fill #0
  Filled 2021-05-10: qty 30, 30d supply, fill #1
  Filled 2021-05-10: qty 30, 30d supply, fill #0
  Filled 2021-06-09: qty 30, 30d supply, fill #1
  Filled 2021-07-10: qty 30, 30d supply, fill #2
  Filled 2021-08-14: qty 30, 30d supply, fill #3
  Filled 2021-09-11: qty 30, 30d supply, fill #4
  Filled 2021-10-05: qty 30, 30d supply, fill #5

## 2021-04-11 ENCOUNTER — Other Ambulatory Visit (HOSPITAL_BASED_OUTPATIENT_CLINIC_OR_DEPARTMENT_OTHER): Payer: Self-pay

## 2021-04-11 ENCOUNTER — Other Ambulatory Visit (HOSPITAL_COMMUNITY): Payer: Self-pay

## 2021-05-10 ENCOUNTER — Other Ambulatory Visit (HOSPITAL_BASED_OUTPATIENT_CLINIC_OR_DEPARTMENT_OTHER): Payer: Self-pay

## 2021-05-10 ENCOUNTER — Other Ambulatory Visit (HOSPITAL_COMMUNITY): Payer: Self-pay

## 2021-06-10 ENCOUNTER — Other Ambulatory Visit (HOSPITAL_COMMUNITY): Payer: Self-pay

## 2021-06-12 ENCOUNTER — Other Ambulatory Visit (HOSPITAL_COMMUNITY): Payer: Self-pay

## 2021-06-26 ENCOUNTER — Other Ambulatory Visit (HOSPITAL_BASED_OUTPATIENT_CLINIC_OR_DEPARTMENT_OTHER): Payer: Self-pay

## 2021-06-26 ENCOUNTER — Other Ambulatory Visit (HOSPITAL_COMMUNITY): Payer: Self-pay

## 2021-07-10 ENCOUNTER — Other Ambulatory Visit (HOSPITAL_COMMUNITY): Payer: Self-pay

## 2021-08-15 ENCOUNTER — Other Ambulatory Visit (HOSPITAL_COMMUNITY): Payer: Self-pay

## 2021-09-04 ENCOUNTER — Other Ambulatory Visit (HOSPITAL_COMMUNITY): Payer: Self-pay

## 2021-09-11 ENCOUNTER — Other Ambulatory Visit (HOSPITAL_COMMUNITY): Payer: Self-pay

## 2021-10-05 ENCOUNTER — Other Ambulatory Visit (HOSPITAL_COMMUNITY): Payer: Self-pay

## 2021-10-09 ENCOUNTER — Other Ambulatory Visit (HOSPITAL_COMMUNITY): Payer: Self-pay

## 2021-11-07 ENCOUNTER — Other Ambulatory Visit (HOSPITAL_COMMUNITY): Payer: Self-pay

## 2021-11-08 ENCOUNTER — Other Ambulatory Visit (HOSPITAL_COMMUNITY): Payer: Self-pay

## 2021-11-08 MED ORDER — FEBUXOSTAT 40 MG PO TABS
60.0000 mg | ORAL_TABLET | Freq: Every day | ORAL | 6 refills | Status: DC
  Filled 2021-11-08: qty 30, 30d supply, fill #0
  Filled 2021-12-19: qty 30, 30d supply, fill #1
  Filled 2022-01-16: qty 30, 30d supply, fill #2
  Filled 2022-08-17 – 2022-09-11 (×2): qty 30, 30d supply, fill #0
  Filled 2022-10-12: qty 30, 30d supply, fill #1

## 2021-11-14 ENCOUNTER — Other Ambulatory Visit (HOSPITAL_COMMUNITY): Payer: Self-pay

## 2021-12-19 ENCOUNTER — Other Ambulatory Visit (HOSPITAL_COMMUNITY): Payer: Self-pay

## 2022-01-16 ENCOUNTER — Other Ambulatory Visit (HOSPITAL_COMMUNITY): Payer: Self-pay

## 2022-01-24 ENCOUNTER — Other Ambulatory Visit (HOSPITAL_COMMUNITY): Payer: Self-pay

## 2022-06-06 ENCOUNTER — Other Ambulatory Visit (HOSPITAL_COMMUNITY): Payer: Self-pay

## 2022-06-06 MED ORDER — LEVOTHYROXINE SODIUM 50 MCG PO TABS
50.0000 ug | ORAL_TABLET | Freq: Every day | ORAL | 4 refills | Status: AC
  Filled 2022-06-06 – 2022-10-05 (×4): qty 90, 90d supply, fill #0
  Filled 2023-02-11: qty 90, 90d supply, fill #1
  Filled 2023-05-08: qty 90, 90d supply, fill #2

## 2022-06-15 ENCOUNTER — Other Ambulatory Visit (HOSPITAL_COMMUNITY): Payer: Self-pay

## 2022-06-19 ENCOUNTER — Other Ambulatory Visit (HOSPITAL_COMMUNITY): Payer: Self-pay

## 2022-08-17 ENCOUNTER — Other Ambulatory Visit (HOSPITAL_COMMUNITY): Payer: Self-pay

## 2022-08-20 ENCOUNTER — Other Ambulatory Visit (HOSPITAL_COMMUNITY): Payer: Self-pay

## 2022-08-21 ENCOUNTER — Other Ambulatory Visit (HOSPITAL_COMMUNITY): Payer: Self-pay

## 2022-08-21 ENCOUNTER — Encounter (HOSPITAL_COMMUNITY): Payer: Self-pay

## 2022-08-24 ENCOUNTER — Other Ambulatory Visit: Payer: Self-pay

## 2022-09-11 ENCOUNTER — Other Ambulatory Visit: Payer: Self-pay

## 2022-09-11 ENCOUNTER — Other Ambulatory Visit (HOSPITAL_COMMUNITY): Payer: Self-pay

## 2022-09-12 ENCOUNTER — Other Ambulatory Visit: Payer: Self-pay

## 2022-09-18 ENCOUNTER — Other Ambulatory Visit: Payer: Self-pay

## 2022-09-18 ENCOUNTER — Other Ambulatory Visit (HOSPITAL_COMMUNITY): Payer: Self-pay

## 2022-09-18 MED ORDER — AZELASTINE HCL 137 MCG/SPRAY NA SOLN
1.0000 | Freq: Two times a day (BID) | NASAL | 8 refills | Status: DC
  Filled 2022-09-18: qty 30, 25d supply, fill #0

## 2022-09-19 ENCOUNTER — Other Ambulatory Visit (HOSPITAL_COMMUNITY): Payer: Self-pay

## 2022-10-05 ENCOUNTER — Other Ambulatory Visit (HOSPITAL_COMMUNITY): Payer: Self-pay

## 2022-10-08 ENCOUNTER — Other Ambulatory Visit (HOSPITAL_COMMUNITY): Payer: Self-pay

## 2022-10-12 ENCOUNTER — Other Ambulatory Visit (HOSPITAL_COMMUNITY): Payer: Self-pay

## 2022-10-15 ENCOUNTER — Other Ambulatory Visit (HOSPITAL_COMMUNITY): Payer: Self-pay

## 2022-10-15 ENCOUNTER — Other Ambulatory Visit: Payer: Self-pay

## 2022-10-15 MED ORDER — ESTRADIOL 10 MCG VA TABS
1.0000 | ORAL_TABLET | VAGINAL | 3 refills | Status: AC
  Filled 2022-10-15: qty 24, 84d supply, fill #0
  Filled 2023-04-03: qty 24, 84d supply, fill #1

## 2022-10-15 MED ORDER — ESTRADIOL 0.1 MG/GM VA CREA
TOPICAL_CREAM | VAGINAL | 0 refills | Status: DC
  Filled 2022-10-15: qty 42.5, 90d supply, fill #0

## 2022-10-16 ENCOUNTER — Other Ambulatory Visit (HOSPITAL_COMMUNITY): Payer: Self-pay

## 2022-11-06 ENCOUNTER — Other Ambulatory Visit (HOSPITAL_COMMUNITY): Payer: Self-pay

## 2022-11-06 DIAGNOSIS — M542 Cervicalgia: Secondary | ICD-10-CM | POA: Diagnosis not present

## 2022-11-06 DIAGNOSIS — M546 Pain in thoracic spine: Secondary | ICD-10-CM | POA: Diagnosis not present

## 2022-11-06 MED ORDER — PREDNISONE 5 MG PO TABS
ORAL_TABLET | ORAL | 1 refills | Status: DC
  Filled 2022-11-06 – 2022-11-13 (×2): qty 21, 6d supply, fill #0

## 2022-11-13 ENCOUNTER — Other Ambulatory Visit (HOSPITAL_COMMUNITY): Payer: Self-pay

## 2022-12-05 ENCOUNTER — Other Ambulatory Visit (HOSPITAL_COMMUNITY): Payer: Self-pay

## 2022-12-06 ENCOUNTER — Other Ambulatory Visit (HOSPITAL_COMMUNITY): Payer: Self-pay

## 2022-12-06 MED ORDER — FEBUXOSTAT 40 MG PO TABS
60.0000 mg | ORAL_TABLET | Freq: Every day | ORAL | 11 refills | Status: AC
  Filled 2022-12-06: qty 30, 30d supply, fill #0
  Filled 2022-12-28: qty 30, 20d supply, fill #0
  Filled 2023-01-03: qty 90, 90d supply, fill #0
  Filled 2023-04-03: qty 90, 90d supply, fill #1
  Filled 2023-08-09 (×2): qty 90, 90d supply, fill #2
  Filled 2023-10-26: qty 90, 90d supply, fill #3

## 2022-12-15 ENCOUNTER — Other Ambulatory Visit (HOSPITAL_COMMUNITY): Payer: Self-pay

## 2022-12-17 ENCOUNTER — Other Ambulatory Visit (HOSPITAL_COMMUNITY): Payer: Self-pay

## 2022-12-25 ENCOUNTER — Other Ambulatory Visit (HOSPITAL_COMMUNITY): Payer: Self-pay

## 2022-12-28 ENCOUNTER — Other Ambulatory Visit (HOSPITAL_COMMUNITY): Payer: Self-pay

## 2022-12-30 MED ORDER — AZELASTINE HCL 137 MCG/SPRAY NA SOLN
1.0000 | Freq: Two times a day (BID) | NASAL | 11 refills | Status: DC
  Filled 2022-12-30 – 2023-01-03 (×2): qty 30, 25d supply, fill #0
  Filled 2023-04-03: qty 30, 25d supply, fill #1

## 2022-12-31 ENCOUNTER — Other Ambulatory Visit: Payer: Self-pay

## 2022-12-31 ENCOUNTER — Encounter: Payer: Self-pay | Admitting: Pharmacist

## 2022-12-31 ENCOUNTER — Other Ambulatory Visit (HOSPITAL_COMMUNITY): Payer: Self-pay

## 2023-01-03 ENCOUNTER — Other Ambulatory Visit: Payer: Self-pay

## 2023-01-03 ENCOUNTER — Other Ambulatory Visit (HOSPITAL_COMMUNITY): Payer: Self-pay

## 2023-01-11 ENCOUNTER — Emergency Department (HOSPITAL_COMMUNITY)
Admission: EM | Admit: 2023-01-11 | Discharge: 2023-01-11 | Disposition: A | Discharge status: Home / Self Care | Payer: 59 | Attending: Emergency Medicine | Admitting: Emergency Medicine

## 2023-01-11 ENCOUNTER — Other Ambulatory Visit: Payer: Self-pay

## 2023-01-11 ENCOUNTER — Emergency Department (HOSPITAL_COMMUNITY): Payer: 59

## 2023-01-11 DIAGNOSIS — Z79899 Other long term (current) drug therapy: Secondary | ICD-10-CM | POA: Diagnosis not present

## 2023-01-11 DIAGNOSIS — R3129 Other microscopic hematuria: Secondary | ICD-10-CM | POA: Diagnosis not present

## 2023-01-11 DIAGNOSIS — N23 Unspecified renal colic: Secondary | ICD-10-CM

## 2023-01-11 DIAGNOSIS — R1032 Left lower quadrant pain: Secondary | ICD-10-CM | POA: Insufficient documentation

## 2023-01-11 DIAGNOSIS — E039 Hypothyroidism, unspecified: Secondary | ICD-10-CM | POA: Diagnosis not present

## 2023-01-11 DIAGNOSIS — N132 Hydronephrosis with renal and ureteral calculous obstruction: Secondary | ICD-10-CM | POA: Diagnosis not present

## 2023-01-11 DIAGNOSIS — K7689 Other specified diseases of liver: Secondary | ICD-10-CM

## 2023-01-11 DIAGNOSIS — R109 Unspecified abdominal pain: Secondary | ICD-10-CM | POA: Diagnosis present

## 2023-01-11 LAB — CBC
HCT: 36.2 % (ref 36.0–46.0)
Hemoglobin: 12.4 g/dL (ref 12.0–15.0)
MCH: 28.8 pg (ref 26.0–34.0)
MCHC: 34.3 g/dL (ref 30.0–36.0)
MCV: 84.2 fL (ref 80.0–100.0)
Platelets: 194 10*3/uL (ref 150–400)
RBC: 4.3 MIL/uL (ref 3.87–5.11)
RDW: 13.5 % (ref 11.5–15.5)
WBC: 7.8 10*3/uL (ref 4.0–10.5)
nRBC: 0 % (ref 0.0–0.2)

## 2023-01-11 LAB — URINALYSIS, ROUTINE W REFLEX MICROSCOPIC
Bilirubin Urine: NEGATIVE
Glucose, UA: NEGATIVE mg/dL
Ketones, ur: 5 mg/dL — AB
Leukocytes,Ua: NEGATIVE
Nitrite: NEGATIVE
Protein, ur: 30 mg/dL — AB
Specific Gravity, Urine: 1.028 (ref 1.005–1.030)
pH: 5 (ref 5.0–8.0)

## 2023-01-11 LAB — BASIC METABOLIC PANEL
Anion gap: 12 (ref 5–15)
BUN: 22 mg/dL — ABNORMAL HIGH (ref 6–20)
CO2: 19 mmol/L — ABNORMAL LOW (ref 22–32)
Calcium: 8.8 mg/dL — ABNORMAL LOW (ref 8.9–10.3)
Chloride: 103 mmol/L (ref 98–111)
Creatinine, Ser: 0.94 mg/dL (ref 0.44–1.00)
GFR, Estimated: >60 mL/min (ref >60)
Glucose, Bld: 130 mg/dL — ABNORMAL HIGH (ref 70–99)
Potassium: 3.9 mmol/L (ref 3.5–5.1)
Sodium: 134 mmol/L — ABNORMAL LOW (ref 135–145)

## 2023-01-11 LAB — POC URINE PREG, ED: Preg Test, Ur: NEGATIVE

## 2023-01-11 MED ORDER — KETOROLAC TROMETHAMINE 30 MG/ML IJ SOLN
30.0000 mg | Freq: Once | INTRAMUSCULAR | Status: AC
  Administered 2023-01-11: 30 mg via INTRAVENOUS
  Filled 2023-01-11: qty 1

## 2023-01-11 MED ORDER — HYDROCODONE-ACETAMINOPHEN 5-325 MG PO TABS
1.0000 | ORAL_TABLET | Freq: Once | ORAL | Status: AC
  Administered 2023-01-11: 1 via ORAL
  Filled 2023-01-11: qty 1

## 2023-01-11 MED ORDER — HYDROCODONE-ACETAMINOPHEN 5-325 MG PO TABS: 1.0000 | ORAL_TABLET | Freq: Four times a day (QID) | ORAL | 0 refills | Status: DC | PRN

## 2023-01-11 MED ORDER — MORPHINE SULFATE (PF) 4 MG/ML IV SOLN
4.0000 mg | Freq: Once | INTRAVENOUS | Status: AC
  Administered 2023-01-11: 4 mg via INTRAVENOUS
  Filled 2023-01-11: qty 1

## 2023-01-11 MED ORDER — ONDANSETRON HCL 4 MG/2ML IJ SOLN
4.0000 mg | Freq: Once | INTRAMUSCULAR | Status: AC
  Administered 2023-01-11: 4 mg via INTRAVENOUS
  Filled 2023-01-11: qty 2

## 2023-01-11 MED ORDER — ONDANSETRON 4 MG PO TBDP: 4.0000 mg | ORAL_TABLET | Freq: Three times a day (TID) | ORAL | 0 refills | Status: DC | PRN

## 2023-01-11 NOTE — ED Notes (Signed)
ED Provider at bedside. 

## 2023-01-11 NOTE — ED Provider Notes (Signed)
I assumed care of patient after she was transferred for CT imaging.  CT scan reveals 3 mm ureteral stone at the UVJ.  No signs of UTI.  She does have multiple incidental findings including diverticulosis and hepatic cyst.  Patient gave me permission to speak to her husband via the phone who is also a physician.  He was made aware of CT findings including her incidental findings, he will arrange follow-up  Overall patient is well-appearing, no acute distress.  Anticipate discharge with home pain medication and urology follow-up   Zadie Rhine, MD 01/11/23 [phone]

## 2023-01-11 NOTE — ED Notes (Signed)
Pt's brother in law present to transport pt to [facility] ED for CT

## 2023-01-11 NOTE — ED Notes (Signed)
IV wrapped in koban to keep IV secure until pt gets to [facility] hospital for CT

## 2023-01-11 NOTE — ED Triage Notes (Addendum)
Pt c/o of left sided flank pain x3 days, reports pain has gotten significantly worse. Denies any urinary symptoms.

## 2023-01-11 NOTE — ED Provider Notes (Signed)
[place] EMERGENCY DEPARTMENT AT [facility] Provider Note   CSN: [id number] Arrival date & time: 01/11/23  0015     History  Chief Complaint  Patient presents with   Flank Pain    Emma Compton is a 57 y.o. female.  Patient is a 57 year old female with history of gout and hypothyroidism.  Patient presenting today with complaints of left-sided flank and abdominal pain.  This has been ongoing intermittently for the past 3 days, then significantly worsened this evening.  Pain starts in the left flank and radiates to the left groin.  She denies any fevers or chills.  She denies any bowel complaints.  She denies any dysuria or hematuria but does state that her urine appeared slightly darker than normal.  The history is provided by the patient.       Home Medications Prior to Admission medications   Medication Sig Start Date End Date Taking? Authorizing Provider  Azelastine HCl 137 MCG/SPRAY SOLN Place 1 2 sprays into both nostrils 2 times daily as needed 12/30/22     estradiol (ESTRACE) 0.1 MG/GM vaginal cream Insert 1 gram vaginally 2 times a week 07/23/22     Estradiol 10 MCG TABS vaginal tablet Place 1 tablet (10 mcg total) vaginally 2 (two) times a week. 11/21/20     Estradiol 10 MCG TABS vaginal tablet Insert 1 tablet vaginally 2 times a week 08/09/22     febuxostat (ULORIC) 40 MG tablet Take 1 and 1/2 tablets (60 mg total) by mouth daily. 12/06/22     levothyroxine (SYNTHROID) 50 MCG tablet Take 1 tablet (50 mcg total) by mouth daily on an empty stomach. 06/06/22     predniSONE (DELTASONE) 5 MG tablet Take as directed by mouth with food over 6 days. 11/06/22   Jene Every, MD      Allergies    Allopurinol    Review of Systems   Review of Systems  All other systems reviewed and are negative.   Physical Exam Updated Vital Signs BP (!) 143/88   Pulse 66   Temp 97.7 F (36.5 C) (Oral)   Resp 16   Ht 5\' 3"  (1.6 m)   Wt 65.8 kg   SpO2 100%   BMI 25.69  kg/m  Physical Exam Vitals and nursing note reviewed.  Constitutional:      General: She is not in acute distress.    Appearance: She is well-developed. She is not diaphoretic.  HENT:     Head: Normocephalic and atraumatic.  Cardiovascular:     Rate and Rhythm: Normal rate and regular rhythm.     Heart sounds: No murmur heard.    No friction rub. No gallop.  Pulmonary:     Effort: Pulmonary effort is normal. No respiratory distress.     Breath sounds: Normal breath sounds. No wheezing.  Abdominal:     General: Bowel sounds are normal. There is no distension.     Palpations: Abdomen is soft.     Tenderness: There is abdominal tenderness. There is left CVA tenderness. There is no guarding or rebound.     Comments: There is mild tenderness to the left lower quadrant.  Musculoskeletal:        General: Normal range of motion.     Cervical back: Normal range of motion and neck supple.  Skin:    General: Skin is warm and dry.  Neurological:     General: No focal deficit present.     Mental Status:  She is alert and oriented to person, place, and time.     ED Results / Procedures / Treatments   Labs (all labs ordered are listed, but only abnormal results are displayed) Labs Reviewed  URINALYSIS, ROUTINE W REFLEX MICROSCOPIC  CBC  BASIC METABOLIC PANEL  POC URINE PREG, ED    EKG None  Radiology No results found.  Procedures Procedures    Medications Ordered in ED Medications  morphine (PF) 4 MG/ML injection 4 mg (has no administration in time range)  ketorolac (TORADOL) 30 MG/ML injection 30 mg (has no administration in time range)  ondansetron (ZOFRAN) injection 4 mg (has no administration in time range)    ED Course/ Medical Decision Making/ A&P  Patient is a 57 year old female presenting with left flank pain.  Patient arrives here with stable vital signs and is afebrile.  Physical examination reveals mild left-sided CVA tenderness and left lower quadrant  tenderness, but no peritoneal signs.  Laboratory studies obtained including CBC and basic metabolic panel.  She has no leukocytosis and electrolytes that are basically unremarkable.  Urinalysis shows microscopic hematuria, but no definitive infection.  My plan was to obtain a CT scan to rule out a renal calculus versus diverticulitis, however the CT scanner at [facility] is not functioning and this cannot be done here.  I have spoken with Dr. Bebe Shaggy at [facility] and patient will be transferred there to undergo this study.  Patient's pain treated with Toradol and morphine x 2 and she also received Zofran for nausea.  Patient transferred by private auto with her brother-in-law driving.  Final Clinical Impression(s) / ED Diagnoses Final diagnoses:  None    Rx / DC Orders ED Discharge Orders     None         Geoffery Lyons, MD 01/11/23 [phone]

## 2023-01-16 DIAGNOSIS — N23 Unspecified renal colic: Secondary | ICD-10-CM | POA: Diagnosis not present

## 2023-01-16 DIAGNOSIS — Q446 Cystic disease of liver: Secondary | ICD-10-CM | POA: Diagnosis not present

## 2023-01-17 DIAGNOSIS — N202 Calculus of kidney with calculus of ureter: Secondary | ICD-10-CM | POA: Diagnosis not present

## 2023-02-05 ENCOUNTER — Other Ambulatory Visit (HOSPITAL_COMMUNITY): Payer: Self-pay | Admitting: Internal Medicine

## 2023-02-05 ENCOUNTER — Ambulatory Visit: Payer: 59

## 2023-02-05 DIAGNOSIS — R109 Unspecified abdominal pain: Secondary | ICD-10-CM

## 2023-02-05 DIAGNOSIS — N23 Unspecified renal colic: Secondary | ICD-10-CM

## 2023-02-06 ENCOUNTER — Ambulatory Visit (HOSPITAL_COMMUNITY)
Admission: RE | Admit: 2023-02-06 | Discharge: 2023-02-06 | Disposition: A | Discharge status: Home / Self Care | Payer: 59 | Source: Ambulatory Visit | Attending: Internal Medicine | Admitting: Internal Medicine

## 2023-02-06 ENCOUNTER — Ambulatory Visit: Payer: 59 | Admitting: Urology

## 2023-02-06 DIAGNOSIS — R109 Unspecified abdominal pain: Secondary | ICD-10-CM | POA: Insufficient documentation

## 2023-02-06 DIAGNOSIS — N23 Unspecified renal colic: Secondary | ICD-10-CM | POA: Insufficient documentation

## 2023-02-06 DIAGNOSIS — N1330 Unspecified hydronephrosis: Secondary | ICD-10-CM | POA: Diagnosis not present

## 2023-02-06 DIAGNOSIS — Z87442 Personal history of urinary calculi: Secondary | ICD-10-CM | POA: Diagnosis not present

## 2023-02-12 ENCOUNTER — Other Ambulatory Visit: Payer: Self-pay

## 2023-02-12 ENCOUNTER — Encounter: Payer: Self-pay | Admitting: Pharmacist

## 2023-02-12 ENCOUNTER — Other Ambulatory Visit (HOSPITAL_COMMUNITY): Payer: Self-pay

## 2023-02-18 DIAGNOSIS — N200 Calculus of kidney: Secondary | ICD-10-CM | POA: Diagnosis not present

## 2023-02-20 DIAGNOSIS — Z1211 Encounter for screening for malignant neoplasm of colon: Secondary | ICD-10-CM | POA: Diagnosis not present

## 2023-02-25 LAB — COLOGUARD: COLOGUARD: NEGATIVE

## 2023-04-03 ENCOUNTER — Other Ambulatory Visit (HOSPITAL_COMMUNITY): Payer: Self-pay

## 2023-04-03 ENCOUNTER — Other Ambulatory Visit: Payer: Self-pay

## 2023-04-04 ENCOUNTER — Other Ambulatory Visit: Payer: Self-pay

## 2023-04-05 ENCOUNTER — Other Ambulatory Visit (HOSPITAL_COMMUNITY): Payer: Self-pay

## 2023-04-05 MED ORDER — TAMSULOSIN HCL 0.4 MG PO CAPS
0.4000 mg | ORAL_CAPSULE | Freq: Every day | ORAL | 1 refills | Status: AC
  Filled 2023-04-05: qty 30, 30d supply, fill #0

## 2023-04-06 ENCOUNTER — Other Ambulatory Visit (HOSPITAL_COMMUNITY): Payer: Self-pay

## 2023-04-22 DIAGNOSIS — Z79899 Other long term (current) drug therapy: Secondary | ICD-10-CM | POA: Diagnosis not present

## 2023-04-22 DIAGNOSIS — E785 Hyperlipidemia, unspecified: Secondary | ICD-10-CM | POA: Diagnosis not present

## 2023-04-22 DIAGNOSIS — M1000 Idiopathic gout, unspecified site: Secondary | ICD-10-CM | POA: Diagnosis not present

## 2023-04-25 DIAGNOSIS — E785 Hyperlipidemia, unspecified: Secondary | ICD-10-CM | POA: Diagnosis not present

## 2023-04-25 DIAGNOSIS — M109 Gout, unspecified: Secondary | ICD-10-CM | POA: Diagnosis not present

## 2023-04-25 DIAGNOSIS — Z0001 Encounter for general adult medical examination with abnormal findings: Secondary | ICD-10-CM | POA: Diagnosis not present

## 2023-04-25 DIAGNOSIS — N200 Calculus of kidney: Secondary | ICD-10-CM | POA: Diagnosis not present

## 2023-04-25 DIAGNOSIS — Z23 Encounter for immunization: Secondary | ICD-10-CM | POA: Diagnosis not present

## 2023-04-25 DIAGNOSIS — E039 Hypothyroidism, unspecified: Secondary | ICD-10-CM | POA: Diagnosis not present

## 2023-04-29 ENCOUNTER — Encounter: Payer: Self-pay | Admitting: Internal Medicine

## 2023-04-29 ENCOUNTER — Other Ambulatory Visit (HOSPITAL_COMMUNITY): Payer: Self-pay | Admitting: Internal Medicine

## 2023-04-29 DIAGNOSIS — M7122 Synovial cyst of popliteal space [Baker], left knee: Secondary | ICD-10-CM

## 2023-05-06 ENCOUNTER — Ambulatory Visit (HOSPITAL_COMMUNITY)
Admission: RE | Admit: 2023-05-06 | Discharge: 2023-05-06 | Disposition: A | Discharge status: Home / Self Care | Payer: 59 | Source: Ambulatory Visit | Attending: Internal Medicine | Admitting: Internal Medicine

## 2023-05-06 DIAGNOSIS — M7122 Synovial cyst of popliteal space [Baker], left knee: Secondary | ICD-10-CM | POA: Insufficient documentation

## 2023-05-08 ENCOUNTER — Other Ambulatory Visit: Payer: Self-pay

## 2023-05-09 ENCOUNTER — Ambulatory Visit (HOSPITAL_COMMUNITY)
Admission: RE | Admit: 2023-05-09 | Discharge: 2023-05-09 | Disposition: A | Discharge status: Home / Self Care | Payer: 59 | Source: Ambulatory Visit | Attending: Internal Medicine | Admitting: Internal Medicine

## 2023-05-09 ENCOUNTER — Other Ambulatory Visit (HOSPITAL_COMMUNITY): Payer: Self-pay | Admitting: Internal Medicine

## 2023-05-09 DIAGNOSIS — R1031 Right lower quadrant pain: Secondary | ICD-10-CM | POA: Insufficient documentation

## 2023-05-09 DIAGNOSIS — K7689 Other specified diseases of liver: Secondary | ICD-10-CM | POA: Diagnosis not present

## 2023-08-02 DIAGNOSIS — H16223 Keratoconjunctivitis sicca, not specified as Sjogren's, bilateral: Secondary | ICD-10-CM | POA: Diagnosis not present

## 2023-08-05 DIAGNOSIS — Z1231 Encounter for screening mammogram for malignant neoplasm of breast: Secondary | ICD-10-CM | POA: Diagnosis not present

## 2023-08-09 ENCOUNTER — Other Ambulatory Visit (HOSPITAL_COMMUNITY): Payer: Self-pay

## 2023-08-09 ENCOUNTER — Other Ambulatory Visit: Payer: Self-pay

## 2023-08-11 ENCOUNTER — Other Ambulatory Visit (HOSPITAL_COMMUNITY): Payer: Self-pay

## 2023-08-11 MED ORDER — LEVOTHYROXINE SODIUM 50 MCG PO TABS
50.0000 ug | ORAL_TABLET | Freq: Every day | ORAL | 4 refills | Status: DC
  Filled 2023-08-11: qty 90, 90d supply, fill #0

## 2023-08-11 MED ORDER — LEVOTHYROXINE SODIUM 50 MCG PO TABS
50.0000 ug | ORAL_TABLET | Freq: Every day | ORAL | 4 refills | Status: DC
  Filled 2023-10-26: qty 90, 90d supply, fill #0

## 2023-08-12 ENCOUNTER — Other Ambulatory Visit (HOSPITAL_COMMUNITY): Payer: Self-pay

## 2023-08-12 ENCOUNTER — Other Ambulatory Visit: Payer: Self-pay

## 2023-08-13 ENCOUNTER — Ambulatory Visit (INDEPENDENT_AMBULATORY_CARE_PROVIDER_SITE_OTHER): Payer: 59 | Admitting: Otolaryngology

## 2023-08-13 ENCOUNTER — Encounter (INDEPENDENT_AMBULATORY_CARE_PROVIDER_SITE_OTHER): Payer: Self-pay

## 2023-08-13 VITALS — BP 127/74 | HR 61 | Resp 19 | Ht 63.0 in | Wt 145.0 lb

## 2023-08-13 DIAGNOSIS — J343 Hypertrophy of nasal turbinates: Secondary | ICD-10-CM | POA: Diagnosis not present

## 2023-08-13 DIAGNOSIS — R0982 Postnasal drip: Secondary | ICD-10-CM | POA: Diagnosis not present

## 2023-08-13 DIAGNOSIS — R519 Headache, unspecified: Secondary | ICD-10-CM

## 2023-08-13 DIAGNOSIS — J328 Other chronic sinusitis: Secondary | ICD-10-CM | POA: Diagnosis not present

## 2023-08-13 DIAGNOSIS — R040 Epistaxis: Secondary | ICD-10-CM | POA: Diagnosis not present

## 2023-08-13 MED ORDER — AYR SALINE NASAL NA GEL: 1.0000 | Freq: Two times a day (BID) | NASAL | 3 refills | Status: AC

## 2023-08-13 MED ORDER — MUPIROCIN 2 % EX OINT: 1.0000 | TOPICAL_OINTMENT | Freq: Two times a day (BID) | CUTANEOUS | 0 refills | Status: AC

## 2023-08-13 NOTE — Progress Notes (Signed)
 Dear Dr. Sheryle, Here is my assessment for our mutual patient, Emma Compton. Thank you for allowing me the opportunity to care for your patient. Please do not hesitate to contact me should you have any other questions. Sincerely, Dr. Eldora Blanch  Otolaryngology Clinic Note Referring provider: Dr. Sheryle HPI:  Emma Compton is a 57 y.o. female kindly referred by Dr. Sheryle for evaluation of epistaxis and facial pressure/pain.  Initial visit (07/2023):   She is having some epistaxis for the past few days. Always on the left side. Has some fresh blood when she cleans her nose. Few drops. She is worried about it, especially because she has also feeling some left facial pressure/pain - some maxillary and frontal pain/pressure - for few weeks. No congestion, no trouble breathing out of the nose, no anterior rhinorrhea. Some post nasal drip for which she uses astelin  as needed. No typical AR symptoms. No dry mouth or AI sx. Left face feels heavier - touching back of head feels sore. Tried sudafed, did not help.   She also reports that she has a foreign body sensation in the eye. She saw Dr. Milford office 3-4 weeks ago for this, and eye exam did not reveal any ulcers or dry eye. This has been ongoing for 3-4 months. She was prescribed steroid eye drops, but has not noted any change. She has dry eye and has been using artificial tears. No epiphora. No vision change  HTN: no CKD/Liver dysfunction: no Anticoagulation/AP: no Trauma: no History of Sinusitis: no Nasal procedures: no Current nasal medication use: no - jus astelin  PRN  No prior sinus CT  H&N Surgery: no Personal or FHx of bleeding dz or anesthesia difficulty: no  AP/AC: no  Tobacco: no. Occupation: housewife. Lives in [place], [place]  PMHx: Hypothyroidism, Gout, Aortic atherosclerosis(?)  Independent Review of Additional Tests or Records:  Dr. Sheryle referral notes (04/25/2023) reviewed and uploaded or available in chart - noted  pain in nose and epistaxis; ref to ENT Labs reviewed: TSH 3.51 (2024); CBC 12/2022: WBC 7.8, Plt 194; BMP 12/2022: no AKI or CKD  PMH/Meds/All/SocHx/FamHx/ROS:  History reviewed. No pertinent past medical history.  See above, updated; none otherwise  History reviewed. No pertinent surgical history.  History reviewed. No pertinent family history.   Social Connections: Not on file      Current Outpatient Medications:    Azelastine  HCl 137 MCG/SPRAY SOLN, Place 1 2 sprays into both nostrils 2 times daily as needed, Disp: 30 mL, Rfl: 11   Estradiol  10 MCG TABS vaginal tablet, Insert 1 tablet vaginally 2 times a week, Disp: 24 tablet, Rfl: 3   febuxostat  (ULORIC ) 40 MG tablet, Take 1 and 1/2 tablets (60 mg total) by mouth daily., Disp: 30 tablet, Rfl: 11   fluorometholone (FML) 0.1 % ophthalmic suspension, Place 1 drop into the left eye 2 (two) times daily., Disp: , Rfl:    levothyroxine  (SYNTHROID ) 50 MCG tablet, Take 1 tablet (50 mcg total) by mouth daily on an empty stomach., Disp: 90 tablet, Rfl: 4   levothyroxine  (SYNTHROID ) 50 MCG tablet, Take 1 tablet (50 mcg total) by mouth daily on an empty stomach., Disp: 90 tablet, Rfl: 4   levothyroxine  (SYNTHROID ) 50 MCG tablet, Take 1 tablet (50 mcg total) by mouth daily on an empty stomach., Disp: 90 tablet, Rfl: 4   saline (AYR) GEL, Place 1 Application into both nostrils in the morning and at bedtime. Start after mupirocin  is done, Disp: 14 g, Rfl: 3  estradiol  (ESTRACE ) 0.1 MG/GM vaginal cream, Insert 1 gram vaginally 2 times a week, Disp: 42.5 g, Rfl: 0   Estradiol  10 MCG TABS vaginal tablet, Place 1 tablet (10 mcg total) vaginally 2 (two) times a week., Disp: 24 tablet, Rfl: 3   HYDROcodone -acetaminophen  (NORCO/VICODIN) 5-325 MG tablet, Take 1 tablet by mouth every 6 (six) hours as needed for severe pain., Disp: 10 tablet, Rfl: 0   ondansetron  (ZOFRAN -ODT) 4 MG disintegrating tablet, Take 1 tablet (4 mg total) by mouth every 8 (eight) hours as  needed., Disp: 15 tablet, Rfl: 0   predniSONE  (DELTASONE ) 5 MG tablet, Take as directed by mouth with food over 6 days. (Patient not taking: Reported on 08/13/2023), Disp: 21 tablet, Rfl: 1   tamsulosin  (FLOMAX ) 0.4 MG CAPS capsule, Take 1 capsule (0.4 mg total) by mouth daily. (Patient not taking: Reported on 08/13/2023), Disp: 15 capsule, Rfl: 1   Physical Exam:   BP 127/74 (BP Location: Left Arm, Patient Position: Sitting, Cuff Size: Normal)   Pulse 61   Resp 19   Ht 5' 3 (1.6 m)   Wt 145 lb (65.8 kg)   SpO2 98%   BMI 25.69 kg/m   Salient findings:  CN II-XII intact  Bilateral EAC clear and TM intact with well pneumatized middle ear spaces Anterior rhinoscopy: Septum dev left; bilateral inferior turbinates with mild hypertrophy No significant epiphora; no lesions around punctum; EOM intact No lesions of oral cavity/oropharynx No obviously palpable neck masses/lymphadenopathy/thyromegaly No respiratory distress or stridor  Seprately Identifiable Procedures:  PROCEDURE: Bilateral Diagnostic Rigid Nasal Endoscopy Pre-procedure diagnosis: Concern for left chronic sinusitis Post-procedure diagnosis: same Indication: See pre-procedure diagnosis and physical exam above Complications: None apparent EBL: 0 mL Anesthesia: Lidocaine 4% and topical decongestant was topically sprayed in each nasal cavity  Description of Procedure:  Patient was identified. A rigid 30 degree endoscope was utilized to evaluate the sinonasal cavities, mucosa, sinus ostia and turbinates and septum.  Overall, signs of mucosal inflammation are not noted.  Also noted are left septal deviation, concern for right concha bullosa.  No significant mucosal abnormalities noted over left maxillary Or nasolacrimal duct area.  No drainage from left inferior meatus area.  Mild prominent left septal vessels.  No active epistaxis.  No mucopurulence, polyps, or masses noted.   Right Middle meatus: Clear Right SE Recess:  Clear Left MM: Clear Left SE Recess: Clear  Photodocumentation was obtained.   CPT CODE -- 68768 - Mod 25   Impression & Plans:  Emma Compton is a 57 y.o. female with:  1. Other chronic sinusitis   2. Left facial pressure and pain   3. Hypertrophy of both inferior nasal turbinates   4. Post-nasal drip   5. Epistaxis    Overall reassuring exam including endoscopy.  She does have some symptoms of sinusitis, but no purulence on endoscopy.  I do not see any evidence of primary nasal pathology or nasolacrimal duct pathology for her left foreign body sensation in the eye.  We discussed options which included medical management and a CT to rule out any occult pathology in the sinuses or over nasolacrimal duct area.  She agrees. We also discussed epistaxis management which included left septal cautery versus medical management.  She opted for medical management  -Epistaxis precautions discussed including humidification.  Will start her on Mupirocin  ointment twice daily and Ayr gel - Consider daily nasal rinses - CT Sinus without contrast - Rest of management per ophtho  - f/u 6 weeks  with CT  See below regarding exact medications prescribed this encounter including dosages and route: Meds ordered this encounter  Medications   mupirocin  ointment (BACTROBAN ) 2 %    Sig: Apply 1 Application topically 2 (two) times daily for 10 days. Apply to nostril twice per day on left    Dispense:  22 g    Refill:  0   saline (AYR) GEL    Sig: Place 1 Application into both nostrils in the morning and at bedtime. Start after mupirocin  is done    Dispense:  14 g    Refill:  3      Thank you for allowing me the opportunity to care for your patient. Please do not hesitate to contact me should you have any other questions.  Sincerely, Eldora Blanch, MD Otolarynoglogist (ENT), [facility] Phone: [phone] Fax: [phone]  08/24/2023, 5:26 PM   MDM:  Level  4 Complexity/Problems addressed: mod - multiple acute problems Data complexity: mod - independent review of notes, labs; ordering test - Morbidity: mod  - Prescription Drug prescribed or managed: yes

## 2023-08-13 NOTE — Patient Instructions (Signed)
 I have ordered an imaging study for you to complete prior to your next visit. Please call [facility] Scheduling at [phone] to schedule your imaging if you have not received a call within 24 hours. If you are unable to complete your imaging study prior to your next scheduled visit please call our office to let us  know.   Apply mupirocin  ointment twice daily for 10 days to left side; then switch to ayr gel until follow up.

## 2023-09-03 ENCOUNTER — Telehealth (INDEPENDENT_AMBULATORY_CARE_PROVIDER_SITE_OTHER): Payer: Self-pay | Admitting: Otolaryngology

## 2023-09-03 NOTE — Telephone Encounter (Signed)
Patient left voicemail stating that she has not had her CT scan done and has a follow up appointment on Thursday 01/23 @ 1:15. She's questioning if she should keep her appointment.

## 2023-09-05 ENCOUNTER — Ambulatory Visit (INDEPENDENT_AMBULATORY_CARE_PROVIDER_SITE_OTHER): Payer: 59

## 2023-09-05 ENCOUNTER — Ambulatory Visit
Admission: RE | Admit: 2023-09-05 | Discharge: 2023-09-05 | Disposition: A | Discharge status: Home / Self Care | Payer: 59 | Source: Ambulatory Visit | Attending: Otolaryngology | Admitting: Otolaryngology

## 2023-09-05 DIAGNOSIS — J328 Other chronic sinusitis: Secondary | ICD-10-CM

## 2023-09-05 DIAGNOSIS — J329 Chronic sinusitis, unspecified: Secondary | ICD-10-CM | POA: Diagnosis not present

## 2023-09-05 DIAGNOSIS — H5712 Ocular pain, left eye: Secondary | ICD-10-CM | POA: Diagnosis not present

## 2023-09-23 ENCOUNTER — Other Ambulatory Visit (HOSPITAL_COMMUNITY): Payer: Self-pay

## 2023-09-23 DIAGNOSIS — Z01411 Encounter for gynecological examination (general) (routine) with abnormal findings: Secondary | ICD-10-CM | POA: Diagnosis not present

## 2023-09-23 DIAGNOSIS — Z113 Encounter for screening for infections with a predominantly sexual mode of transmission: Secondary | ICD-10-CM | POA: Diagnosis not present

## 2023-09-23 DIAGNOSIS — Z124 Encounter for screening for malignant neoplasm of cervix: Secondary | ICD-10-CM | POA: Diagnosis not present

## 2023-09-23 DIAGNOSIS — Z01419 Encounter for gynecological examination (general) (routine) without abnormal findings: Secondary | ICD-10-CM | POA: Diagnosis not present

## 2023-09-23 DIAGNOSIS — Z1331 Encounter for screening for depression: Secondary | ICD-10-CM | POA: Diagnosis not present

## 2023-09-23 MED ORDER — ESTRADIOL 10 MCG VA TABS
10.0000 ug | ORAL_TABLET | VAGINAL | 3 refills | Status: AC
  Filled 2023-09-23: qty 24, 84d supply, fill #0

## 2023-09-25 ENCOUNTER — Telehealth (INDEPENDENT_AMBULATORY_CARE_PROVIDER_SITE_OTHER): Payer: Self-pay | Admitting: Otolaryngology

## 2023-09-25 NOTE — Telephone Encounter (Signed)
Reminder Call: Date: 09/26/2023 Status: Sch  Time: 11:45 AM [address] [place], [place] [address]  Confirmed time and location w/patient.

## 2023-09-26 ENCOUNTER — Ambulatory Visit (INDEPENDENT_AMBULATORY_CARE_PROVIDER_SITE_OTHER): Payer: 59 | Admitting: Otolaryngology

## 2023-09-26 ENCOUNTER — Other Ambulatory Visit (HOSPITAL_COMMUNITY): Payer: Self-pay

## 2023-09-26 VITALS — BP 116/75 | HR 63

## 2023-09-26 DIAGNOSIS — J343 Hypertrophy of nasal turbinates: Secondary | ICD-10-CM

## 2023-09-26 DIAGNOSIS — J328 Other chronic sinusitis: Secondary | ICD-10-CM | POA: Diagnosis not present

## 2023-09-26 DIAGNOSIS — R0981 Nasal congestion: Secondary | ICD-10-CM | POA: Diagnosis not present

## 2023-09-26 DIAGNOSIS — J3489 Other specified disorders of nose and nasal sinuses: Secondary | ICD-10-CM | POA: Diagnosis not present

## 2023-09-26 DIAGNOSIS — J342 Deviated nasal septum: Secondary | ICD-10-CM

## 2023-09-26 DIAGNOSIS — R040 Epistaxis: Secondary | ICD-10-CM

## 2023-09-26 MED ORDER — AMOXICILLIN-POT CLAVULANATE 875-125 MG PO TABS: 1.0000 | ORAL_TABLET | Freq: Two times a day (BID) | ORAL | 0 refills | Status: AC

## 2023-09-26 MED ORDER — PREDNISONE 10 MG PO TABS: ORAL_TABLET | ORAL | 0 refills | Status: AC

## 2023-09-26 NOTE — Progress Notes (Signed)
 Dear Dr. Ouida Sills, Here is my assessment for our mutual patient, Emma Compton. Thank you for allowing me the opportunity to care for your patient. Please do not hesitate to contact me should you have any other questions. Sincerely, Dr. Jovita Kussmaul  Otolaryngology Clinic Note Referring provider: Dr. Ouida Sills HPI:  Emma Compton is a 58 y.o. female kindly referred by Dr. Ouida Sills for evaluation of epistaxis and facial pressure/pain.  Initial visit (07/2023):   She is having some epistaxis for the past few days. Always on the left side. Has some fresh blood when she cleans her nose. Few drops. She is worried about it, especially because she has also feeling some left facial pressure/pain - some maxillary and frontal pain/pressure - for few weeks. No congestion, no trouble breathing out of the nose, no anterior rhinorrhea. Some post nasal drip for which she uses astelin as needed. No typical AR symptoms. No dry mouth or AI sx. Left face feels heavier - touching back of head feels sore. Tried sudafed, did not help.   She also reports that she has a foreign body sensation in the eye. She saw Dr. Laruth Bouchard office 3-4 weeks ago for this, and eye exam did not reveal any ulcers or dry eye. This has been ongoing for 3-4 months. She was prescribed steroid eye drops, but has not noted any change. She has dry eye and has been using artificial tears. No epiphora. No vision change --------------------------------------------------------- 09/26/2023 Reports pain is in medial corner of eye and frontal area. "Feels like something is there"/pressure sensation. No h/o sinus infections. Epistaxis has resolved. Tried zyrtec - did not help.Did not enjoy rinses She did have a sinus CT which we discussed -------------------------------------------------------------  H&N Surgery: no Personal or FHx of bleeding dz or anesthesia difficulty: no  AP/AC: no  Tobacco: no. Lives in [place], [place]  PMHx: Hypothyroidism, Gout,  Aortic atherosclerosis(?)  Independent Review of Additional Tests or Records:  Dr. Ouida Sills referral notes (04/25/2023) reviewed and uploaded or available in chart - noted pain in nose and epistaxis; ref to ENT Labs reviewed: TSH 3.51 (2024); CBC 12/2022: WBC 7.8, Plt 194; BMP 12/2022: no AKI or CKD CT Sinus 09/05/2023 independently interpreted: left ethmoid opacification, significant left septal deviation, right concha bullosa; left sphenoid opacification modest   PMH/Meds/All/SocHx/FamHx/ROS:  History reviewed. No pertinent past medical history.  See above, updated; none otherwise  History reviewed. No pertinent surgical history.  History reviewed. No pertinent family history.   Social Connections: Not on file      Current Outpatient Medications:    amoxicillin-clavulanate (AUGMENTIN) 875-125 MG tablet, Take 1 tablet by mouth 2 (two) times daily for 12 days., Disp: 24 tablet, Rfl: 0   Azelastine HCl 137 MCG/SPRAY SOLN, Place 1 2 sprays into both nostrils 2 times daily as needed, Disp: 30 mL, Rfl: 11   Estradiol (VAGIFEM) 10 MCG TABS vaginal tablet, Place 1 tablet (10 mcg total) vaginally 2 (two) times a week., Disp: 26 tablet, Rfl: 3   Estradiol 10 MCG TABS vaginal tablet, Insert 1 tablet vaginally 2 times a week, Disp: 24 tablet, Rfl: 3   febuxostat (ULORIC) 40 MG tablet, Take 1 and 1/2 tablets (60 mg total) by mouth daily., Disp: 30 tablet, Rfl: 11   fluorometholone (FML) 0.1 % ophthalmic suspension, Place 1 drop into the left eye 2 (two) times daily., Disp: , Rfl:    levothyroxine (SYNTHROID) 50 MCG tablet, Take 1 tablet (50 mcg total) by mouth daily on an empty stomach., Disp: 90  tablet, Rfl: 4   levothyroxine (SYNTHROID) 50 MCG tablet, Take 1 tablet (50 mcg total) by mouth daily on an empty stomach., Disp: 90 tablet, Rfl: 4   levothyroxine (SYNTHROID) 50 MCG tablet, Take 1 tablet (50 mcg total) by mouth daily on an empty stomach., Disp: 90 tablet, Rfl: 4   predniSONE (DELTASONE) 10 MG  tablet, Take 3 tablets (30 mg total) by mouth daily with breakfast for 4 days, THEN 2 tablets (20 mg total) daily with breakfast for 4 days, THEN 1 tablet (10 mg total) daily with breakfast for 4 days., Disp: 24 tablet, Rfl: 0   saline (AYR) GEL, Place 1 Application into both nostrils in the morning and at bedtime. Start after mupirocin is done, Disp: 14 g, Rfl: 3   tamsulosin (FLOMAX) 0.4 MG CAPS capsule, Take 1 capsule (0.4 mg total) by mouth daily. (Patient not taking: Reported on 09/26/2023), Disp: 15 capsule, Rfl: 1   Physical Exam:   BP 116/75 (BP Location: Left Arm, Patient Position: Sitting, Cuff Size: Normal)   Pulse 63   SpO2 (!) 75%   Salient findings:  CN II-XII intact  Bilateral EAC clear and TM intact with well pneumatized middle ear spaces Anterior rhinoscopy: Septum dev left; bilateral inferior turbinates with mild hypertrophy No significant epiphora; no lesions around punctum; EOM intact No lesions of oral cavity/oropharynx No obviously palpable neck masses/lymphadenopathy/thyromegaly No respiratory distress or stridor  Seprately Identifiable Procedures:  PROCEDURE: Bilateral Diagnostic Rigid Nasal Endoscopy Prior, not today  Description of Procedure:  Patient was identified. A rigid 30 degree endoscope was utilized to evaluate the sinonasal cavities, mucosa, sinus ostia and turbinates and septum.  Overall, signs of mucosal inflammation are not noted.  Also noted are left septal deviation, concern for right concha bullosa.  No significant mucosal abnormalities noted over left maxillary Or nasolacrimal duct area.  No drainage from left inferior meatus area.  Mild prominent left septal vessels.  No active epistaxis.  No mucopurulence, polyps, or masses noted.   Right Middle meatus: Clear Right SE Recess: Clear Left MM: Clear Left SE Recess: Clear  Photodocumentation was obtained.   CPT CODE -- 56213 - Mod 25   Impression & Plans:  Emma Compton is a 58 y.o. female  with:  1. Other chronic sinusitis   2. Nasal septal deviation   3. Nasal obstruction   4. Nasal congestion   5. Hypertrophy of both inferior nasal turbinates   6. Concha bullosa    She does have some symptoms of sinusitis, but no purulence on endoscopy.  Symptoms also seem to correlate with CT opacification. We discussed options which included medical management v/s FESS. I think more appropriate to try medical management first. Epistaxis has resolved.  -Start augmentin BID x12d - Daily pred taper x12d - Daily sinus rinses - Flonase BID  - f/u 6 weeks; if persistent sx, will consider left FESS; not sure if eye symptoms will improve, but pressure sensation should; would likely need septoplasty as well  See below regarding exact medications prescribed this encounter including dosages and route: Meds ordered this encounter  Medications   amoxicillin-clavulanate (AUGMENTIN) 875-125 MG tablet    Sig: Take 1 tablet by mouth 2 (two) times daily for 12 days.    Dispense:  24 tablet    Refill:  0   predniSONE (DELTASONE) 10 MG tablet    Sig: Take 3 tablets (30 mg total) by mouth daily with breakfast for 4 days, THEN 2 tablets (20 mg total) daily  with breakfast for 4 days, THEN 1 tablet (10 mg total) daily with breakfast for 4 days.    Dispense:  24 tablet    Refill:  0      Thank you for allowing me the opportunity to care for your patient. Please do not hesitate to contact me should you have any other questions.  Sincerely, Jovita Kussmaul, MD Otolaryngologist (ENT), [facility] Phone: [phone] Fax: [phone]  10/06/2023, 2:53 PM   MDM:  Level 4 - 99214 Complexity/Problems addressed: mod - multiple problems Data complexity: mod - independent review/interpretaiton of imaging - Morbidity: mod  - Prescription Drug prescribed or managed: yes

## 2023-09-26 NOTE — Patient Instructions (Addendum)
Take Augmentin 875 mg by mouth (PO) twice daily for 12 days; take with food, take probiotic or yogurt with it Take Prednisone by mouth (PO) 30mg  x 4 days (3 pills in morning), then 20mg  x4 days (2 pills), then 10mg  x 4 days (1 pill), then stop. Risks discussed  Flonase - two sprays each nostril twice daily Daily Lloyd Huger Med Nasal Saline Rinse   - start nasal saline rinses with NeilMed Bottle available over the counter    Nasal Saline Irrigation instructions: If you choose to make your own salt water solution, You will need: Salt (kosher, canning, or pickling salt) Baking soda Nasal irrigation bottle (i.e. Lloyd Huger Med Sinus Rinse) Measuring spoon ( teaspoon) Distilled / boiled water   Mix solution Mix 1 teaspoon of salt, 1/2 teaspoon of baking soda and 1 cup of water into irrigation bottle ** May use saline packet instead of homemade recipe for this step if you prefer If medicine was prescribed to be mixed with solution, place this into bottle Examples 2 inches of 2% mupirocin ointment Budesonide solution Position your head: Lean over sink (about 45 degrees) Rotate head (about 45 degrees) so that one nostril is above the other Irrigate Insert tip of irrigation bottle into upper nostril so it forms a comfortable seal Irrigate while breathing through your mouth May remove the straw from the bottle in order to irrigate the entire solution (important if medicine was added) Exhale through nose when finished and blow nose as necessary  Repeat on opposite side with other 1/2 of solution (120 mL) or remake solution if all 240 mL was used on first side Wash irrigation bottle regularly, replace every 3 months

## 2023-09-27 DIAGNOSIS — H1045 Other chronic allergic conjunctivitis: Secondary | ICD-10-CM | POA: Diagnosis not present

## 2023-09-27 DIAGNOSIS — H527 Unspecified disorder of refraction: Secondary | ICD-10-CM | POA: Diagnosis not present

## 2023-09-27 DIAGNOSIS — H2513 Age-related nuclear cataract, bilateral: Secondary | ICD-10-CM | POA: Diagnosis not present

## 2023-10-02 ENCOUNTER — Other Ambulatory Visit (HOSPITAL_COMMUNITY): Payer: Self-pay

## 2023-10-06 ENCOUNTER — Encounter (INDEPENDENT_AMBULATORY_CARE_PROVIDER_SITE_OTHER): Payer: Self-pay

## 2023-10-22 ENCOUNTER — Other Ambulatory Visit (HOSPITAL_COMMUNITY): Payer: Self-pay

## 2023-10-27 ENCOUNTER — Other Ambulatory Visit (HOSPITAL_COMMUNITY): Payer: Self-pay

## 2023-10-28 ENCOUNTER — Other Ambulatory Visit (HOSPITAL_COMMUNITY): Payer: Self-pay

## 2023-10-28 ENCOUNTER — Other Ambulatory Visit: Payer: Self-pay

## 2023-10-28 ENCOUNTER — Encounter (HOSPITAL_COMMUNITY): Payer: Self-pay | Admitting: Pharmacist

## 2023-10-29 ENCOUNTER — Other Ambulatory Visit (HOSPITAL_COMMUNITY): Payer: Self-pay

## 2023-10-29 ENCOUNTER — Other Ambulatory Visit: Payer: Self-pay

## 2023-10-30 ENCOUNTER — Other Ambulatory Visit: Payer: Self-pay

## 2023-11-08 ENCOUNTER — Ambulatory Visit (INDEPENDENT_AMBULATORY_CARE_PROVIDER_SITE_OTHER): Payer: 59

## 2023-11-16 ENCOUNTER — Other Ambulatory Visit (HOSPITAL_COMMUNITY): Payer: Self-pay

## 2023-11-18 ENCOUNTER — Ambulatory Visit (INDEPENDENT_AMBULATORY_CARE_PROVIDER_SITE_OTHER): Admitting: Otolaryngology

## 2023-11-18 ENCOUNTER — Encounter (INDEPENDENT_AMBULATORY_CARE_PROVIDER_SITE_OTHER): Payer: Self-pay

## 2023-11-18 VITALS — BP 112/68 | HR 74 | Ht 63.0 in | Wt 143.0 lb

## 2023-11-18 DIAGNOSIS — J343 Hypertrophy of nasal turbinates: Secondary | ICD-10-CM

## 2023-11-18 DIAGNOSIS — J342 Deviated nasal septum: Secondary | ICD-10-CM

## 2023-11-18 DIAGNOSIS — J3489 Other specified disorders of nose and nasal sinuses: Secondary | ICD-10-CM | POA: Diagnosis not present

## 2023-11-18 DIAGNOSIS — R0981 Nasal congestion: Secondary | ICD-10-CM | POA: Diagnosis not present

## 2023-11-18 DIAGNOSIS — R519 Headache, unspecified: Secondary | ICD-10-CM | POA: Diagnosis not present

## 2023-11-18 DIAGNOSIS — J328 Other chronic sinusitis: Secondary | ICD-10-CM | POA: Diagnosis not present

## 2023-11-18 NOTE — Progress Notes (Signed)
 Dear Dr. Ouida Sills, Here is my assessment for our mutual patient, Emma Compton. Thank you for allowing me the opportunity to care for your patient. Please do not hesitate to contact me should you have any other questions. Sincerely, Dr. Jovita Kussmaul  Otolaryngology Clinic Note Referring provider: Dr. Ouida Sills HPI:  Emma Compton is a 58 y.o. female kindly referred by Dr. Ouida Sills for evaluation of epistaxis and facial pressure/pain.  Initial visit (07/2023):   She is having some epistaxis for the past few days. Always on the left side. Has some fresh blood when she cleans her nose. Few drops. She is worried about it, especially because she has also feeling some left facial pressure/pain - some maxillary and frontal pain/pressure - for few weeks. No congestion, no trouble breathing out of the nose, no anterior rhinorrhea. Some post nasal drip for which she uses astelin as needed. No typical AR symptoms. No dry mouth or AI sx. Left face feels heavier - touching back of head feels sore. Tried sudafed, did not help.   She also reports that she has a foreign body sensation in the eye. She saw Dr. Laruth Bouchard office 3-4 weeks ago for this, and eye exam did not reveal any ulcers or dry eye. This has been ongoing for 3-4 months. She was prescribed steroid eye drops, but has not noted any change. She has dry eye and has been using artificial tears. No epiphora. No vision change --------------------------------------------------------- 09/26/2023 Reports pain is in medial corner of eye and frontal area. "Feels like something is there"/pressure sensation. No h/o sinus infections. Epistaxis has resolved. Tried zyrtec - did not help.Did not enjoy rinses She did have a sinus CT which we discussed --------------------------------------------------------- 11/18/2023 Pain and pressure have significantly improved - still there some but does not bother her anymore. Other sinonasal sx have resolved. No epistaxis. She did have a  dental problem with recent dental work as well. She is on zyrtec, s/p steroids/abx.  ----------------------------------------------------------  H&N Surgery: no Personal or FHx of bleeding dz or anesthesia difficulty: no  AP/AC: no  Tobacco: no. Lives in [place], [place]  PMHx: Hypothyroidism, Gout, Aortic atherosclerosis(?)  Independent Review of Additional Tests or Records:  Dr. Ouida Sills referral notes (04/25/2023) reviewed and uploaded or available in chart - noted pain in nose and epistaxis; ref to ENT Labs reviewed: TSH 3.51 (2024); CBC 12/2022: WBC 7.8, Plt 194; BMP 12/2022: no AKI or CKD CT Sinus 09/05/2023 independently interpreted: left ethmoid opacification, significant left septal deviation, right concha bullosa; left sphenoid opacification modest   PMH/Meds/All/SocHx/FamHx/ROS:  History reviewed. No pertinent past medical history.  See above, updated; none otherwise  History reviewed. No pertinent surgical history.  History reviewed. No pertinent family history.   Social Connections: Not on file      Current Outpatient Medications:    Azelastine HCl 137 MCG/SPRAY SOLN, Place 1 2 sprays into both nostrils 2 times daily as needed, Disp: 30 mL, Rfl: 11   Estradiol (VAGIFEM) 10 MCG TABS vaginal tablet, Place 1 tablet (10 mcg total) vaginally 2 (two) times a week., Disp: 26 tablet, Rfl: 3   Estradiol 10 MCG TABS vaginal tablet, Insert 1 tablet vaginally 2 times a week, Disp: 24 tablet, Rfl: 3   febuxostat (ULORIC) 40 MG tablet, Take 1 and 1/2 tablets (60 mg total) by mouth daily., Disp: 30 tablet, Rfl: 11   levothyroxine (SYNTHROID) 50 MCG tablet, Take 1 tablet (50 mcg total) by mouth daily on an empty stomach., Disp: 90 tablet, Rfl: 4  levothyroxine (SYNTHROID) 50 MCG tablet, Take 1 tablet (50 mcg total) by mouth daily on an empty stomach., Disp: 90 tablet, Rfl: 4   saline (AYR) GEL, Place 1 Application into both nostrils in the morning and at bedtime. Start after mupirocin is  done, Disp: 14 g, Rfl: 3   fluorometholone (FML) 0.1 % ophthalmic suspension, Place 1 drop into the left eye 2 (two) times daily. (Patient not taking: Reported on 11/18/2023), Disp: , Rfl:    tamsulosin (FLOMAX) 0.4 MG CAPS capsule, Take 1 capsule (0.4 mg total) by mouth daily. (Patient not taking: Reported on 11/18/2023), Disp: 15 capsule, Rfl: 1   Physical Exam:   BP 112/68 (BP Location: Left Arm, Patient Position: Sitting, Cuff Size: Normal)   Pulse 74   Ht 5\' 3"  (1.6 m)   Wt 143 lb (64.9 kg)   SpO2 96%   BMI 25.33 kg/m   Salient findings:  CN II-XII intact Anterior rhinoscopy: Septum dev left; bilateral inferior turbinates with mild hypertrophy; Nasal endoscopy was indicated to better evaluate the nose and paranasal sinuses, given the patient's history and exam findings, and is detailed below. No significant epiphora; no lesions around punctum; EOM intact No respiratory distress or stridor  Seprately Identifiable Procedures:  PROCEDURE: Bilateral Diagnostic Rigid Nasal Endoscopy Pre-procedure diagnosis: Concern for chronic sinusitis Post-procedure diagnosis: same Indication: See pre-procedure diagnosis and physical exam above Complications: None apparent EBL: 0 mL Anesthesia: Lidocaine 4% and topical decongestant was topically sprayed in each nasal cavity  Description of Procedure:  Patient was identified. A rigid 30 degree endoscope was utilized to evaluate the sinonasal cavities, mucosa, sinus ostia and turbinates and septum.  Overall, signs of mucosal inflammation are not noted.  Also noted are massive left septal deviation, right concha bullosa.  No mucopurulence, polyps, or masses noted.   Right Middle meatus: clear Right SE Recess: clear Left MM: clear today Left SE Recess: clear  CPT CODE -- 40981 - Mod 25   Impression & Plans:  Emma Compton is a 58 y.o. female with:  1. Other chronic sinusitis   2. Nasal septal deviation   3. Nasal obstruction   4. Nasal congestion    5. Hypertrophy of both inferior nasal turbinates   6. Concha bullosa   7. Left facial pressure and pain    She does have some symptoms of sinusitis, but no purulence on endoscopy. Symptoms are much better after abx/steroids. We did discuss repeat CT v/s FESS - might help completely resolve sx since she does have some chronic pressure still but given improvement (and does not bother her), will observe. If symptoms recur, would recommend FESS  - Can continue flonase sensimist BID - Sinus rinses as needed  - f/u PRN  See below regarding exact medications prescribed this encounter including dosages and route: No orders of the defined types were placed in this encounter.     Thank you for allowing me the opportunity to care for your patient. Please do not hesitate to contact me should you have any other questions.  Sincerely, Jovita Kussmaul, MD Otolaryngologist (ENT), [facility] Phone: [phone] Fax: [phone]  11/18/2023, 9:36 AM   MDM:  Level 4 - 99213 Complexity/Problems addressed: mod - multiple problems Data complexity: low - Morbidity: low - Prescription Drug prescribed or managed: no

## 2023-12-17 ENCOUNTER — Encounter (INDEPENDENT_AMBULATORY_CARE_PROVIDER_SITE_OTHER): Payer: Self-pay

## 2023-12-17 ENCOUNTER — Ambulatory Visit (INDEPENDENT_AMBULATORY_CARE_PROVIDER_SITE_OTHER): Admitting: Otolaryngology

## 2023-12-17 VITALS — BP 116/74 | HR 71 | Ht 63.0 in | Wt 140.0 lb

## 2023-12-17 DIAGNOSIS — J342 Deviated nasal septum: Secondary | ICD-10-CM | POA: Diagnosis not present

## 2023-12-17 DIAGNOSIS — J343 Hypertrophy of nasal turbinates: Secondary | ICD-10-CM | POA: Diagnosis not present

## 2023-12-17 DIAGNOSIS — J328 Other chronic sinusitis: Secondary | ICD-10-CM | POA: Diagnosis not present

## 2023-12-17 DIAGNOSIS — R519 Headache, unspecified: Secondary | ICD-10-CM | POA: Diagnosis not present

## 2023-12-17 DIAGNOSIS — J3489 Other specified disorders of nose and nasal sinuses: Secondary | ICD-10-CM

## 2023-12-17 DIAGNOSIS — R0981 Nasal congestion: Secondary | ICD-10-CM | POA: Diagnosis not present

## 2023-12-17 NOTE — Patient Instructions (Signed)
 I have ordered an imaging study for you to complete prior to your next visit. Please call [facility] Scheduling at [phone] to schedule your imaging if you have not received a call within 24 hours. If you are unable to complete your imaging study prior to your next scheduled visit please call our office to let us know.

## 2023-12-17 NOTE — Progress Notes (Signed)
 Dear Dr. Hildy Lowers, Here is my assessment for our mutual patient, Emma Compton. Thank you for allowing me the opportunity to care for your patient. Please do not hesitate to contact me should you have any other questions. Sincerely, Dr. Milon Aloe  Otolaryngology Clinic Note Referring provider: Dr. Hildy Lowers HPI:  Emma Compton is a 58 y.o. female kindly referred by Dr. Hildy Lowers for evaluation of epistaxis and facial pressure/pain.  Initial visit (07/2023):   She is having some epistaxis for the past few days. Always on the left side. Has some fresh blood when she cleans her nose. Few drops. She is worried about it, especially because she has also feeling some left facial pressure/pain - some maxillary and frontal pain/pressure - for few weeks. No congestion, no trouble breathing out of the nose, no anterior rhinorrhea. Some post nasal drip for which she uses astelin  as needed. No typical AR symptoms. No dry mouth or AI sx. Left face feels heavier - touching back of head feels sore. Tried sudafed, did not help.   She also reports that she has a foreign body sensation in the eye. She saw Dr. Marvin Slot office 3-4 weeks ago for this, and eye exam did not reveal any ulcers or dry eye. This has been ongoing for 3-4 months. She was prescribed steroid eye drops, but has not noted any change. She has dry eye and has been using artificial tears. No epiphora. No vision change --------------------------------------------------------- 09/26/2023 Reports pain is in medial corner of eye and frontal area. "Feels like something is there"/pressure sensation. No h/o sinus infections. Epistaxis has resolved. Tried zyrtec - did not help.Did not enjoy rinses She did have a sinus CT which we discussed --------------------------------------------------------- 11/18/2023 Pain and pressure have significantly improved - still there some but does not bother her anymore. Other sinonasal sx have resolved. No epistaxis. She did have a  dental problem with recent dental work as well. She is on zyrtec, s/p steroids/abx.  --------------------------------------------------------- 12/17/2023 Returns for follow up. She reports that she has had recurrence of left facial pressure and pain over max and medial orbit area. No congestion or drainage. She is continuing with flonase and PO antistamine and tried rinses prior. We discussed options and given prior CT would like to proceed with FESS. No vision changes.  H&N Surgery: no Personal or FHx of bleeding dz or anesthesia difficulty: no  AP/AC: no  Tobacco: no. Lives in [place], [place]  PMHx: Hypothyroidism, Gout, Aortic atherosclerosis(?)  Independent Review of Additional Tests or Records:  Dr. Hildy Lowers referral notes (04/25/2023) reviewed and uploaded or available in chart - noted pain in nose and epistaxis; ref to ENT Labs reviewed: TSH 3.51 (2024); CBC 12/2022: WBC 7.8, Plt 194; BMP 12/2022: no AKI or CKD CT Sinus 09/05/2023 independently interpreted: left ethmoid opacification, significant left septal deviation, right concha bullosa; left sphenoid opacification modest; there is also some right [facility] narrowing/opacification but max and ethmoid are aerated   PMH/Meds/All/SocHx/FamHx/ROS:  History reviewed. No pertinent past medical history.  See above, updated; none otherwise  History reviewed. No pertinent surgical history.  History reviewed. No pertinent family history.   Social Connections: Not on file      Current Outpatient Medications:    Azelastine  HCl 137 MCG/SPRAY SOLN, Place 1 2 sprays into both nostrils 2 times daily as needed, Disp: 30 mL, Rfl: 11   Estradiol  (VAGIFEM ) 10 MCG TABS vaginal tablet, Place 1 tablet (10 mcg total) vaginally 2 (two) times a week., Disp: 26 tablet, Rfl: 3  Estradiol  10 MCG TABS vaginal tablet, Insert 1 tablet vaginally 2 times a week, Disp: 24 tablet, Rfl: 3   febuxostat  (ULORIC ) 40 MG tablet, Take 1 and 1/2 tablets (60 mg total) by mouth  daily., Disp: 30 tablet, Rfl: 11   levothyroxine  (SYNTHROID ) 50 MCG tablet, Take 1 tablet (50 mcg total) by mouth daily on an empty stomach., Disp: 90 tablet, Rfl: 4   fluorometholone (FML) 0.1 % ophthalmic suspension, Place 1 drop into the left eye 2 (two) times daily. (Patient not taking: Reported on 12/17/2023), Disp: , Rfl:    saline (AYR) GEL, Place 1 Application into both nostrils in the morning and at bedtime. Start after mupirocin  is done (Patient not taking: Reported on 12/17/2023), Disp: 14 g, Rfl: 3   tamsulosin  (FLOMAX ) 0.4 MG CAPS capsule, Take 1 capsule (0.4 mg total) by mouth daily. (Patient not taking: Reported on 12/17/2023), Disp: 15 capsule, Rfl: 1   Physical Exam:   BP 116/74 (BP Location: Right Arm, Patient Position: Sitting, Cuff Size: Normal)   Pulse 71   Ht 5\' 3"  (1.6 m)   Wt 140 lb (63.5 kg)   SpO2 97%   BMI 24.80 kg/m   Salient findings:  CN II-XII intact Anterior rhinoscopy: Septum dev left; bilateral inferior turbinates with mild hypertrophy; Nasal endoscopy recommended but declined No significant epiphora; no lesions around punctum; EOM intact No respiratory distress or stridor  Seprately Identifiable Procedures:  PROCEDURE: Bilateral Diagnostic Rigid Nasal Endoscopy Prior, not today Description of Procedure:  Patient was identified. A rigid 30 degree endoscope was utilized to evaluate the sinonasal cavities, mucosa, sinus ostia and turbinates and septum.  Overall, signs of mucosal inflammation are not noted.  Also noted are massive left septal deviation, right concha bullosa.  No mucopurulence, polyps, or masses noted.   Right Middle meatus: clear Right SE Recess: clear Left MM: clear today Left SE Recess: clear   Impression & Plans:  Emma Compton is a 58 y.o. female with:  1. Other chronic sinusitis   2. Nasal septal deviation   3. Nasal obstruction   4. Nasal congestion   5. Hypertrophy of both inferior nasal turbinates   6. Concha bullosa   7. Left  facial pressure and pain    Based on imaging and history, she is having left chronic sinusitis symptoms with exacerbation despite medical management with sprays, and abx/steroids on post-treatment scan. Symptoms are much better after abx/steroids, so we decided to initially hold off on FESS BUT they have quickly recurred with symptoms correlating to area of opacification.   As such, given recurrence and now symptoms ongoing for several months with brief improvement on abx/steroids despite being on medical management with rinses, nasal sprays and PO anthistamine, we discussed left FESS and septo/turbs/concha bullosa.  We discussed the goals of septoplasty and turbinate reduction, and expectations for postoperative management. Will plan to leave splints in place, and removal was also discussed. We also discussed nasal obstruction post-operatively until splints in place and pain management.  We discussed R/B/A including pain, infection, bleeding (<3% risk of operative visit for control), persistent symptoms, need for revision surgery, and other risks including damage to surrounding structures, septal perforation (<1%), anesthetic complications, among others.  We discussed the goals of sinus surgery, and expectations for postoperative management. We discussed R/B/A including pain, infection, bleeding (<3% risk of operative visit for control), persistent symptoms, need for revision surgery, and other risks including damage to the eye and loss of vision, and injury to skull  base with risk of CSF leak and additional intracranial complications (<1%), anesthetic complications, among others.    We discussed use of nasal saline spray and nasal saline irrigations post-operatively We also discussed use of intranasal steroid post-operatively until healing occurs  Patient understands and is ready to proceed but due to upcoming travel, wishes to hold off until the summer. In interim, I offered abx/steroids but she  declined.  - Continue flonase sensimist BID - Daily Sinus rinses - Will obtain pre-op Stealth CT given disease abuts eye and skull base.  - Will post for septoplasty, turbinate reduction, right concha bullosa resection, left maxillary antrostomy, left total ethmoidectomy and possible left sphenoidectomy once she is ready to proceed (no right sided sx) - Will Rx periop abx/steroids at that point - Advised to call back should her sx worsen in interim  See below regarding exact medications prescribed this encounter including dosages and route: No orders of the defined types were placed in this encounter.     Thank you for allowing me the opportunity to care for your patient. Please do not hesitate to contact me should you have any other questions.  Sincerely, Milon Aloe, MD Otolaryngologist (ENT), [facility] Phone: [phone] Fax: [phone]  12/17/2023, 4:38 PM   I have personally spent 31 minutes involved in face-to-face and non-face-to-face activities for this patient on the day of the visit.  Professional time spent excludes any procedures performed but includes the following activities, in addition to those noted in the documentation: preparing to see the patient (review of outside documentation and results), performing a medically appropriate examination, extensive counseling,  documenting in the electronic health record

## 2023-12-31 ENCOUNTER — Other Ambulatory Visit (HOSPITAL_COMMUNITY): Payer: Self-pay

## 2024-01-01 ENCOUNTER — Other Ambulatory Visit (HOSPITAL_COMMUNITY): Payer: Self-pay

## 2024-01-01 ENCOUNTER — Other Ambulatory Visit: Payer: Self-pay

## 2024-01-01 MED ORDER — AZELASTINE HCL 137 MCG/SPRAY NA SOLN
NASAL | 11 refills | Status: AC
Start: 2023-12-31 — End: ?
  Filled 2024-01-01: qty 30, 25d supply, fill #0

## 2024-01-01 NOTE — Addendum Note (Signed)
 Addended by: Jerret Mcbane on: 01/01/2024 12:38 PM   Modules accepted: Orders

## 2024-05-11 DIAGNOSIS — M1000 Idiopathic gout, unspecified site: Secondary | ICD-10-CM | POA: Diagnosis not present

## 2024-05-11 DIAGNOSIS — E785 Hyperlipidemia, unspecified: Secondary | ICD-10-CM | POA: Diagnosis not present

## 2024-05-11 DIAGNOSIS — Z79899 Other long term (current) drug therapy: Secondary | ICD-10-CM | POA: Diagnosis not present

## 2024-05-19 DIAGNOSIS — Z0001 Encounter for general adult medical examination with abnormal findings: Secondary | ICD-10-CM | POA: Diagnosis not present

## 2024-05-19 DIAGNOSIS — M549 Dorsalgia, unspecified: Secondary | ICD-10-CM | POA: Diagnosis not present

## 2024-05-19 DIAGNOSIS — E031 Congenital hypothyroidism without goiter: Secondary | ICD-10-CM | POA: Diagnosis not present

## 2024-05-19 DIAGNOSIS — N9489 Other specified conditions associated with female genital organs and menstrual cycle: Secondary | ICD-10-CM | POA: Diagnosis not present

## 2024-05-19 DIAGNOSIS — R3129 Other microscopic hematuria: Secondary | ICD-10-CM | POA: Diagnosis not present

## 2024-05-19 DIAGNOSIS — Z23 Encounter for immunization: Secondary | ICD-10-CM | POA: Diagnosis not present

## 2024-05-19 DIAGNOSIS — E785 Hyperlipidemia, unspecified: Secondary | ICD-10-CM | POA: Diagnosis not present

## 2024-05-19 DIAGNOSIS — M1000 Idiopathic gout, unspecified site: Secondary | ICD-10-CM | POA: Diagnosis not present

## 2024-06-17 DIAGNOSIS — M25511 Pain in right shoulder: Secondary | ICD-10-CM | POA: Diagnosis not present

## 2024-07-08 DIAGNOSIS — M25511 Pain in right shoulder: Secondary | ICD-10-CM | POA: Diagnosis not present

## 2024-08-28 ENCOUNTER — Telehealth (INDEPENDENT_AMBULATORY_CARE_PROVIDER_SITE_OTHER): Payer: Self-pay

## 2024-08-28 NOTE — Telephone Encounter (Signed)
 Patient called in stating she is having pressure in her left cheek she thinks it may be a sinus infection. Patient was wondering if Dr. Tobie could send in something to help. Please advise.

## 2024-08-31 MED ORDER — AMOXICILLIN-POT CLAVULANATE 875-125 MG PO TABS: 1.0000 | ORAL_TABLET | Freq: Two times a day (BID) | ORAL | 0 refills | Status: AC

## 2024-08-31 NOTE — Telephone Encounter (Signed)
 Called her augmentin  for 10 days. Take with food. Can you let her know?

## 2024-08-31 NOTE — Telephone Encounter (Signed)
 Called patient to let her know what Antibiotic that Dr. Tobie had sent over for her. Patient stated she is out of stated I spoke with Dr.Patel to get permission to call it in to the pharmacy where she is at, Dr. Tobie gave the okay. Called the pharmacy in [place]  gave the prescription, then let the patient know it was there. Patient understood.

## 2024-08-31 NOTE — Addendum Note (Signed)
 Addended by: Jaylene Schrom on: 08/31/2024 12:44 PM   Modules accepted: Orders

## 2024-09-18 ENCOUNTER — Telehealth (INDEPENDENT_AMBULATORY_CARE_PROVIDER_SITE_OTHER): Payer: Self-pay | Admitting: Otolaryngology

## 2024-09-18 NOTE — Telephone Encounter (Signed)
 Called and spoke with patient regarding her imagining appt. for a ct sinus that was ordered on 12/17/2023 by Dr. Tobie. Patient stated she never had appt scheduled. I will check with provider to see if he wants to still her without imagining.

## 2024-09-22 ENCOUNTER — Ambulatory Visit (INDEPENDENT_AMBULATORY_CARE_PROVIDER_SITE_OTHER): Payer: Self-pay | Admitting: Otolaryngology
